# Patient Record
Sex: Female | Born: 1990 | Race: White | Hispanic: No | Smoking: Never smoker
Health system: Southern US, Community
[De-identification: ages and names within clinical notes are randomized; demographics above are authoritative.]

## PROBLEM LIST (undated history)

## (undated) DIAGNOSIS — F329 Major depressive disorder, single episode, unspecified: Secondary | ICD-10-CM

## (undated) DIAGNOSIS — F32A Depression, unspecified: Secondary | ICD-10-CM

## (undated) DIAGNOSIS — R87612 Low grade squamous intraepithelial lesion on cytologic smear of cervix (LGSIL): Secondary | ICD-10-CM

## (undated) DIAGNOSIS — F419 Anxiety disorder, unspecified: Secondary | ICD-10-CM

## (undated) DIAGNOSIS — Z803 Family history of malignant neoplasm of breast: Secondary | ICD-10-CM

## (undated) HISTORY — DX: Low grade squamous intraepithelial lesion on cytologic smear of cervix (LGSIL): R87.612

## (undated) HISTORY — DX: Depression, unspecified: F32.A

## (undated) HISTORY — DX: Family history of malignant neoplasm of breast: Z80.3

## (undated) HISTORY — DX: Major depressive disorder, single episode, unspecified: F32.9

## (undated) HISTORY — DX: Anxiety disorder, unspecified: F41.9

## (undated) HISTORY — PX: INTRAUTERINE DEVICE (IUD) INSERTION: SHX5877

---

## 2014-02-26 ENCOUNTER — Emergency Department (HOSPITAL_COMMUNITY): Payer: No Typology Code available for payment source

## 2014-02-26 ENCOUNTER — Encounter (HOSPITAL_COMMUNITY): Payer: Self-pay | Admitting: Emergency Medicine

## 2014-02-26 ENCOUNTER — Emergency Department (HOSPITAL_COMMUNITY)
Admission: EM | Admit: 2014-02-26 | Discharge: 2014-02-26 | Disposition: A | Discharge status: Home / Self Care | Payer: No Typology Code available for payment source | Attending: Emergency Medicine | Admitting: Emergency Medicine

## 2014-02-26 DIAGNOSIS — T148XXA Other injury of unspecified body region, initial encounter: Secondary | ICD-10-CM

## 2014-02-26 DIAGNOSIS — Z793 Long term (current) use of hormonal contraceptives: Secondary | ICD-10-CM | POA: Diagnosis not present

## 2014-02-26 DIAGNOSIS — S299XXA Unspecified injury of thorax, initial encounter: Secondary | ICD-10-CM | POA: Diagnosis present

## 2014-02-26 DIAGNOSIS — W25XXXA Contact with sharp glass, initial encounter: Secondary | ICD-10-CM | POA: Diagnosis not present

## 2014-02-26 DIAGNOSIS — R0789 Other chest pain: Secondary | ICD-10-CM

## 2014-02-26 DIAGNOSIS — S1093XA Contusion of unspecified part of neck, initial encounter: Secondary | ICD-10-CM | POA: Diagnosis not present

## 2014-02-26 DIAGNOSIS — T148 Other injury of unspecified body region: Secondary | ICD-10-CM | POA: Diagnosis not present

## 2014-02-26 DIAGNOSIS — Y9389 Activity, other specified: Secondary | ICD-10-CM | POA: Diagnosis not present

## 2014-02-26 DIAGNOSIS — S0081XA Abrasion of other part of head, initial encounter: Secondary | ICD-10-CM | POA: Insufficient documentation

## 2014-02-26 DIAGNOSIS — Z23 Encounter for immunization: Secondary | ICD-10-CM | POA: Insufficient documentation

## 2014-02-26 DIAGNOSIS — Y92410 Unspecified street and highway as the place of occurrence of the external cause: Secondary | ICD-10-CM | POA: Insufficient documentation

## 2014-02-26 DIAGNOSIS — S24109A Unspecified injury at unspecified level of thoracic spinal cord, initial encounter: Secondary | ICD-10-CM | POA: Diagnosis not present

## 2014-02-26 DIAGNOSIS — S20219A Contusion of unspecified front wall of thorax, initial encounter: Secondary | ICD-10-CM | POA: Diagnosis not present

## 2014-02-26 LAB — CBC WITH DIFFERENTIAL/PLATELET
BASOS ABS: 0 10*3/uL (ref 0.0–0.1)
Basophils Relative: 0 % (ref 0–1)
Eosinophils Absolute: 0.1 10*3/uL (ref 0.0–0.7)
Eosinophils Relative: 1 % (ref 0–5)
HCT: 39.2 % (ref 36.0–46.0)
Hemoglobin: 13 g/dL (ref 12.0–15.0)
LYMPHS PCT: 21 % (ref 12–46)
Lymphs Abs: 2 10*3/uL (ref 0.7–4.0)
MCH: 28.3 pg (ref 26.0–34.0)
MCHC: 33.2 g/dL (ref 30.0–36.0)
MCV: 85.2 fL (ref 78.0–100.0)
Monocytes Absolute: 0.9 10*3/uL (ref 0.1–1.0)
Monocytes Relative: 9 % (ref 3–12)
NEUTROS ABS: 6.4 10*3/uL (ref 1.7–7.7)
Neutrophils Relative %: 69 % (ref 43–77)
PLATELETS: 226 10*3/uL (ref 150–400)
RBC: 4.6 MIL/uL (ref 3.87–5.11)
RDW: 13.2 % (ref 11.5–15.5)
WBC: 9.3 10*3/uL (ref 4.0–10.5)

## 2014-02-26 LAB — I-STAT CHEM 8, ED
BUN: 9 mg/dL (ref 6–23)
CHLORIDE: 103 meq/L (ref 96–112)
Calcium, Ion: 1.19 mmol/L (ref 1.12–1.23)
Creatinine, Ser: 0.6 mg/dL (ref 0.50–1.10)
Glucose, Bld: 102 mg/dL — ABNORMAL HIGH (ref 70–99)
HCT: 43 % (ref 36.0–46.0)
Hemoglobin: 14.6 g/dL (ref 12.0–15.0)
Potassium: 4.2 mEq/L (ref 3.7–5.3)
SODIUM: 139 meq/L (ref 137–147)
TCO2: 23 mmol/L (ref 0–100)

## 2014-02-26 LAB — I-STAT BETA HCG BLOOD, ED (MC, WL, AP ONLY)

## 2014-02-26 MED ORDER — IBUPROFEN 600 MG PO TABS: 600.0000 mg | ORAL_TABLET | Freq: Four times a day (QID) | ORAL | Status: DC | PRN

## 2014-02-26 MED ORDER — TRAMADOL HCL 50 MG PO TABS: 50.0000 mg | ORAL_TABLET | Freq: Four times a day (QID) | ORAL | Status: DC | PRN

## 2014-02-26 MED ORDER — NEOMYCIN-POLYMYXIN-PRAMOXINE 1 % EX CREA
TOPICAL_CREAM | Freq: Two times a day (BID) | CUTANEOUS | Status: DC
Start: 2014-02-26 — End: 2017-07-04

## 2014-02-26 MED ORDER — TETANUS-DIPHTH-ACELL PERTUSSIS 5-2.5-18.5 LF-MCG/0.5 IM SUSP
0.5000 mL | Freq: Once | INTRAMUSCULAR | Status: AC
  Administered 2014-02-26: 0.5 mL via INTRAMUSCULAR
  Filled 2014-02-26: qty 0.5

## 2014-02-26 MED ORDER — FENTANYL CITRATE 0.05 MG/ML IJ SOLN
50.0000 ug | Freq: Once | INTRAMUSCULAR | Status: AC
  Administered 2014-02-26: 50 ug via INTRAVENOUS
  Filled 2014-02-26: qty 2

## 2014-02-26 MED ORDER — LIDOCAINE-EPINEPHRINE (PF) 2 %-1:200000 IJ SOLN
10.0000 mL | Freq: Once | INTRAMUSCULAR | Status: DC
  Filled 2014-02-26: qty 20

## 2014-02-26 MED ORDER — LORAZEPAM 2 MG/ML IJ SOLN
1.0000 mg | Freq: Once | INTRAMUSCULAR | Status: AC
  Administered 2014-02-26: 1 mg via INTRAVENOUS
  Filled 2014-02-26: qty 1

## 2014-02-26 MED ORDER — TETANUS-DIPHTH-ACELL PERTUSSIS 5-2.5-18.5 LF-MCG/0.5 IM SUSP
0.5000 mL | Freq: Once | INTRAMUSCULAR | Status: AC
Start: 2014-02-26 — End: 2014-02-26

## 2014-02-26 NOTE — ED Notes (Signed)
Dr. Nanavanti at the bedside.  

## 2014-02-26 NOTE — ED Notes (Signed)
Offered pain medication, patient does not want at this time.
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
## 2014-02-26 NOTE — ED Notes (Signed)
Dr. Shyrl NumbersNanavanti at the bedside to update on results.

## 2014-08-28 ENCOUNTER — Encounter: Payer: Self-pay | Admitting: Obstetrics & Gynecology

## 2014-08-28 ENCOUNTER — Ambulatory Visit (INDEPENDENT_AMBULATORY_CARE_PROVIDER_SITE_OTHER): Payer: BLUE CROSS/BLUE SHIELD | Admitting: Obstetrics & Gynecology

## 2014-08-28 VITALS — BP 120/59 | HR 62 | Resp 16 | Ht 63.0 in | Wt 162.0 lb

## 2014-08-28 DIAGNOSIS — Z01812 Encounter for preprocedural laboratory examination: Secondary | ICD-10-CM

## 2014-08-28 DIAGNOSIS — Z30019 Encounter for initial prescription of contraceptives, unspecified: Secondary | ICD-10-CM

## 2014-08-28 DIAGNOSIS — R635 Abnormal weight gain: Secondary | ICD-10-CM

## 2014-08-28 DIAGNOSIS — Z113 Encounter for screening for infections with a predominantly sexual mode of transmission: Secondary | ICD-10-CM | POA: Diagnosis not present

## 2014-08-28 DIAGNOSIS — Z30430 Encounter for insertion of intrauterine contraceptive device: Secondary | ICD-10-CM

## 2014-08-28 MED ORDER — LEVONORGESTREL 13.5 MG IU IUD
1.0000 | INTRAUTERINE_SYSTEM | Freq: Once | INTRAUTERINE | Status: AC
  Administered 2014-08-28: 1 via INTRAUTERINE

## 2014-08-28 NOTE — Addendum Note (Signed)
Addended by: Granville LewisLARK, Alicen Donalson L on: 08/28/2014 12:15 PM   Modules accepted: Orders

## 2014-08-28 NOTE — Progress Notes (Signed)
   Subjective:    Patient ID: Suzanne Boyd, female    DOB: 06-06-1990, 24 y.o.   MRN: [id number]  HPI 24 yo SW G0 here today to discuss contraception and weight gain. She has gained 20 # in the last year. She denies hair loss or constipation.  Coitarche at 24 yo, 25 partners in lifetime. Used depo provera in high school but had weight gain. She tried OCPs but sometimes forgot to take them.   Review of Systems     Objective:   Physical Exam She was menstruating. UPT negative, consent signed, Time out procedure done. Cervix prepped with betadine and grasped with a single tooth tenaculum. Christean GriefSkyla was easily placed and the strings were cut to 3-4 cm. Uterus sounded to 8 cm. She tolerated the procedure well.         Assessment & Plan:  Preventative care- STI testing RTC for pap as she is on her period today.

## 2014-08-28 NOTE — Addendum Note (Signed)
Addended by: Granville LewisLARK, Margarito Dehaas L on: 08/28/2014 12:18 PM   Modules accepted: Orders

## 2014-08-29 LAB — GC/CHLAMYDIA PROBE AMP
CT Probe RNA: NEGATIVE
GC Probe RNA: NEGATIVE

## 2014-10-03 ENCOUNTER — Ambulatory Visit (INDEPENDENT_AMBULATORY_CARE_PROVIDER_SITE_OTHER): Payer: BLUE CROSS/BLUE SHIELD | Admitting: Obstetrics & Gynecology

## 2014-10-03 ENCOUNTER — Encounter: Payer: Self-pay | Admitting: Obstetrics & Gynecology

## 2014-10-03 VITALS — BP 130/73 | HR 69 | Resp 16 | Ht 63.0 in | Wt 162.0 lb

## 2014-10-03 DIAGNOSIS — Z124 Encounter for screening for malignant neoplasm of cervix: Secondary | ICD-10-CM | POA: Diagnosis not present

## 2014-10-03 DIAGNOSIS — Z01419 Encounter for gynecological examination (general) (routine) without abnormal findings: Secondary | ICD-10-CM | POA: Diagnosis not present

## 2014-10-03 DIAGNOSIS — Z113 Encounter for screening for infections with a predominantly sexual mode of transmission: Secondary | ICD-10-CM | POA: Diagnosis not present

## 2014-10-03 DIAGNOSIS — Z0000 Encounter for general adult medical examination without abnormal findings: Secondary | ICD-10-CM

## 2014-10-03 NOTE — Progress Notes (Signed)
Subjective:    Suzanne Boyd is a 24 y.o. SW 380female who presents for an annual exam. The patient has no complaints today except that she is having pain with and after IC since getting the Mirena last month.  The patient is sexually active. GYN screening history: last pap: was normal. The patient wears seatbelts: yes. The patient participates in regular exercise: no. Has the patient ever been transfused or tattooed?: yes. The patient reports that there is not domestic violence in her life.   Menstrual History: OB History    Gravida Para Term Preterm AB TAB SAB Ectopic Multiple Living   0 0 0 0 0 0 0 0 0 0       Menarche age: [age]  Patient's last menstrual period was 10/01/2014.    The following portions of the patient's history were reviewed and updated as appropriate: allergies, current medications, past family history, past medical history, past social history, past surgical history and problem list.  Review of Systems Pertinent items are noted in HPI. Monogamous for 6 months. Coitarche at [age]. Works at [employer].DesiresSTI testing.   Objective:    BP 130/73 mmHg  Pulse 69  Resp 16  Ht 5\' 3"  (1.6 m)  Wt 162 lb (73.483 kg)  BMI 28.70 kg/m2  LMP 10/01/2014  General Appearance:    Alert, cooperative, no distress, appears stated age  Head:    Normocephalic, without obvious abnormality, atraumatic  Eyes:    PERRL, conjunctiva/corneas clear, EOM's intact, fundi    benign, both eyes  Ears:    Normal TM's and external ear canals, both ears  Nose:   Nares normal, septum midline, mucosa normal, no drainage    or sinus tenderness  Throat:   Lips, mucosa, and tongue normal; teeth and gums normal  Neck:   Supple, symmetrical, trachea midline, no adenopathy;    thyroid:  no enlargement/tenderness/nodules; no carotid   bruit or JVD  Back:     Symmetric, no curvature, ROM normal, no CVA tenderness  Lungs:     Clear to auscultation bilaterally, respirations unlabored  Chest Wall:    No  tenderness or deformity   Heart:    Regular rate and rhythm, S1 and S2 normal, no murmur, rub   or gallop  Breast Exam:    No tenderness, masses, or nipple abnormality  Abdomen:     Soft, non-tender, bowel sounds active all four quadrants,    no masses, no organomegaly  Genitalia:    Normal female without lesion, discharge or tenderness, Mirena strings seen about 2-3 cm long, NSSA, NT, normal adnexa     Extremities:   Extremities normal, atraumatic, no cyanosis or edema  Pulses:   2+ and symmetric all extremities  Skin:   Skin color, texture, turgor normal, no rashes or lesions  Lymph nodes:   Cervical, supraclavicular, and axillary nodes normal  Neurologic:   CNII-XII intact, normal strength, sensation and reflexes    throughout  .    Assessment:    Healthy female exam.    Plan:      Thin prep pap STI testing Gyn u/s if IBU doesn't cure her dyspareunia

## 2014-10-04 ENCOUNTER — Telehealth: Payer: Self-pay | Admitting: *Deleted

## 2014-10-04 LAB — SYPHILIS: RPR W/REFLEX TO RPR TITER AND TREPONEMAL ANTIBODIES, TRADITIONAL SCREENING AND DIAGNOSIS ALGORITHM

## 2014-10-04 LAB — HIV ANTIBODY (ROUTINE TESTING W REFLEX): HIV 1&2 Ab, 4th Generation: NONREACTIVE

## 2014-10-04 LAB — HEPATITIS B SURFACE ANTIGEN: Hepatitis B Surface Ag: NEGATIVE

## 2014-10-04 LAB — HEPATITIS C ANTIBODY: HCV Ab: NEGATIVE

## 2014-10-04 NOTE — Telephone Encounter (Signed)
LM on voicemail that recent labs drawn were neg

## 2014-10-07 LAB — CYTOLOGY - PAP

## 2014-10-10 ENCOUNTER — Ambulatory Visit: Payer: BLUE CROSS/BLUE SHIELD | Admitting: Obstetrics & Gynecology

## 2014-10-11 ENCOUNTER — Encounter: Payer: Self-pay | Admitting: Obstetrics & Gynecology

## 2014-10-11 DIAGNOSIS — IMO0002 Reserved for concepts with insufficient information to code with codable children: Secondary | ICD-10-CM | POA: Insufficient documentation

## 2014-10-15 ENCOUNTER — Telehealth: Payer: Self-pay | Admitting: *Deleted

## 2014-10-15 ENCOUNTER — Encounter: Payer: Self-pay | Admitting: *Deleted

## 2014-10-15 NOTE — Telephone Encounter (Signed)
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  Thanks

## 2014-10-15 NOTE — Telephone Encounter (Signed)
Letter sent to pt to call office to discuss pap results as she needs a colpo

## 2014-10-24 ENCOUNTER — Other Ambulatory Visit: Payer: BLUE CROSS/BLUE SHIELD

## 2014-10-30 ENCOUNTER — Ambulatory Visit: Payer: BLUE CROSS/BLUE SHIELD | Admitting: Obstetrics & Gynecology

## 2014-11-06 ENCOUNTER — Telehealth: Payer: Self-pay | Admitting: *Deleted

## 2014-11-06 NOTE — Telephone Encounter (Signed)
Certified letter mailed to pt's home address to inform her to call the office to discuss her abnormal pap smear.

## 2014-11-27 ENCOUNTER — Encounter: Payer: Self-pay | Admitting: Obstetrics & Gynecology

## 2014-11-27 ENCOUNTER — Ambulatory Visit (INDEPENDENT_AMBULATORY_CARE_PROVIDER_SITE_OTHER): Payer: BLUE CROSS/BLUE SHIELD | Admitting: Obstetrics & Gynecology

## 2014-11-27 VITALS — BP 105/62 | HR 57 | Resp 16 | Ht 63.0 in | Wt 159.0 lb

## 2014-11-27 DIAGNOSIS — Z01812 Encounter for preprocedural laboratory examination: Secondary | ICD-10-CM | POA: Diagnosis not present

## 2014-11-27 DIAGNOSIS — R87810 Cervical high risk human papillomavirus (HPV) DNA test positive: Secondary | ICD-10-CM | POA: Diagnosis not present

## 2014-11-27 DIAGNOSIS — R87612 Low grade squamous intraepithelial lesion on cytologic smear of cervix (LGSIL): Secondary | ICD-10-CM

## 2014-11-27 DIAGNOSIS — Z30431 Encounter for routine checking of intrauterine contraceptive device: Secondary | ICD-10-CM | POA: Diagnosis not present

## 2014-11-27 DIAGNOSIS — IMO0002 Reserved for concepts with insufficient information to code with codable children: Secondary | ICD-10-CM

## 2014-11-27 DIAGNOSIS — T8384XA Pain from genitourinary prosthetic devices, implants and grafts, initial encounter: Secondary | ICD-10-CM | POA: Diagnosis not present

## 2014-11-27 LAB — POCT URINE PREGNANCY: Preg Test, Ur: NEGATIVE

## 2014-11-27 NOTE — Addendum Note (Signed)
Addended by: Granville LewisLARK, Staria Birkhead L on: 11/27/2014 11:25 AM   Modules accepted: Orders

## 2014-11-27 NOTE — Patient Instructions (Signed)
Return to clinic for any scheduled appointments or for any gynecologic concerns as needed.   

## 2014-11-27 NOTE — Progress Notes (Signed)
    GYNECOLOGY CLINIC COLPOSCOPY PROCEDURE NOTE/MIRENA SURVEILLANCE NOTE  24 y.o. G0 here for colposcopy for low-grade squamous intraepithelial neoplasia (LGSIL - encompassing HPV,mild dysplasia,CIN I) cannot rule out high grade lesion pap smear on 10/03/2014. Discussed role for HPV in cervical dysplasia, need for surveillance.  Patient also desires IUD removal. Worried about increased risk of ectopic pregnancy, also reports occasional cramping.  Mirena was placed on 08/28/14.  Had a long discussion about expected cramping after IUD placement which can last up to six months, also emphasized overall rare rate of pregnancy with it being the most effect LARC, and hence decreased risk of ectopic pregnancy. Patient will leave it in for now, will call or return if she has more symptoms.  For the colposcopy, she was given informed consent, signed copy in the chart, time out was performed.  Placed in lithotomy position. Cervix viewed with speculum and colposcope after application of acetic acid. IUD strings visualized.  Colposcopy adequate? Yes with help of endocervical speculum. Acetowhite lesion(s) noted at 6 o'clock; corresponding biopsies obtained.  ECC specimen obtained.  During biopsy process, IUD strings were inadvertently clipped leaving about 1 cm in length outside external cervical os.  Patient was informed and reassured that this did not interfere with its function. All specimens were labelled and sent to pathology.  Patient was given post procedure instructions.  Will follow up pathology and manage accordingly.  Routine preventative health maintenance measures emphasized.  Total face-to-face time with patient: 30 minutes; 15 minutes of encounter was spent on counseling about Mirena IUD, expected side effects, efficacy and risks and further coordination of care.   Jaynie CollinsUGONNA  Suzanne Magley, MD, FACOG Attending Obstetrician & Gynecologist Center for [facility], [facility]

## 2014-11-29 ENCOUNTER — Telehealth: Payer: Self-pay | Admitting: *Deleted

## 2014-11-29 NOTE — Telephone Encounter (Signed)
Pt notified of neg colposcopy and needs repeat pap in 1 year.

## 2014-11-29 NOTE — Telephone Encounter (Signed)
-----   Message from Tereso NewcomerUgonna A Anyanwu, MD sent at 11/29/2014 11:23 AM EDT ----- Benign colposcopy, repeat pap next year. Please call to inform patient of results and need for appointment.

## 2015-06-03 ENCOUNTER — Other Ambulatory Visit (INDEPENDENT_AMBULATORY_CARE_PROVIDER_SITE_OTHER): Payer: Self-pay | Admitting: *Deleted

## 2015-06-03 ENCOUNTER — Ambulatory Visit: Payer: BLUE CROSS/BLUE SHIELD | Admitting: Obstetrics & Gynecology

## 2015-06-03 VITALS — BP 119/69 | HR 69 | Resp 16 | Wt 159.0 lb

## 2015-06-03 DIAGNOSIS — Z113 Encounter for screening for infections with a predominantly sexual mode of transmission: Secondary | ICD-10-CM

## 2015-06-03 DIAGNOSIS — N760 Acute vaginitis: Secondary | ICD-10-CM

## 2015-06-03 DIAGNOSIS — T7589XA Other specified effects of external causes, initial encounter: Secondary | ICD-10-CM

## 2015-06-03 NOTE — Progress Notes (Signed)
Pt here with c/o vaginal discharge with odor.  She states that she does have a new partner and would like STD testing as well.  Speculum exam did show a frothy discharge with odor.  Wet prep sent to lab along with GC.Clhamydia and STD panel.  Will notify pt with results.

## 2015-06-04 ENCOUNTER — Telehealth: Payer: Self-pay | Admitting: *Deleted

## 2015-06-04 DIAGNOSIS — N760 Acute vaginitis: Principal | ICD-10-CM

## 2015-06-04 DIAGNOSIS — B9689 Other specified bacterial agents as the cause of diseases classified elsewhere: Secondary | ICD-10-CM

## 2015-06-04 LAB — HIV ANTIBODY (ROUTINE TESTING W REFLEX): HIV 1&2 Ab, 4th Generation: NONREACTIVE

## 2015-06-04 LAB — GC/CHLAMYDIA PROBE AMP
CT PROBE, AMP APTIMA: NOT DETECTED
GC PROBE AMP APTIMA: NOT DETECTED

## 2015-06-04 LAB — HEPATITIS C ANTIBODY: HCV Ab: NEGATIVE

## 2015-06-04 LAB — SYPHILIS: RPR W/REFLEX TO RPR TITER AND TREPONEMAL ANTIBODIES, TRADITIONAL SCREENING AND DIAGNOSIS ALGORITHM

## 2015-06-04 LAB — HEPATITIS B SURFACE ANTIGEN: HEP B S AG: NEGATIVE

## 2015-06-04 MED ORDER — METRONIDAZOLE 500 MG PO TABS: 500.0000 mg | ORAL_TABLET | Freq: Two times a day (BID) | ORAL | Status: DC

## 2015-06-04 MED ORDER — FLUCONAZOLE 150 MG PO TABS: 150.0000 mg | ORAL_TABLET | Freq: Once | ORAL | Status: DC

## 2015-06-04 NOTE — Telephone Encounter (Signed)
Pt notified of test results.  Per protocol Flagyl 500 mg sent to her pharmacy along with 1 Diflucan 150.

## 2017-07-04 ENCOUNTER — Ambulatory Visit (INDEPENDENT_AMBULATORY_CARE_PROVIDER_SITE_OTHER): Payer: Commercial Managed Care - PPO | Admitting: Obstetrics and Gynecology

## 2017-07-04 ENCOUNTER — Telehealth: Payer: Self-pay | Admitting: Obstetrics and Gynecology

## 2017-07-04 ENCOUNTER — Encounter: Payer: Self-pay | Admitting: Obstetrics and Gynecology

## 2017-07-04 VITALS — BP 110/70 | HR 56 | Ht 63.0 in | Wt 173.0 lb

## 2017-07-04 DIAGNOSIS — Z6830 Body mass index (BMI) 30.0-30.9, adult: Secondary | ICD-10-CM | POA: Diagnosis not present

## 2017-07-04 DIAGNOSIS — E669 Obesity, unspecified: Secondary | ICD-10-CM | POA: Insufficient documentation

## 2017-07-04 DIAGNOSIS — E663 Overweight: Secondary | ICD-10-CM | POA: Insufficient documentation

## 2017-07-04 DIAGNOSIS — Z124 Encounter for screening for malignant neoplasm of cervix: Secondary | ICD-10-CM

## 2017-07-04 LAB — HM PAP SMEAR: HM Pap smear: NORMAL

## 2017-07-04 NOTE — Progress Notes (Signed)
Gynecology Annual Exam   PCP: Patient, No Pcp Per  Chief Complaint:  Chief Complaint  Patient presents with  . Gynecologic Exam    NPA    History of Present Illness: Patient is a 27 y.o. G0P0000 presents for annual exam. The patient has no complaints today.   LMP: Patient's last menstrual period was 06/20/2017 (exact date). Average Interval: irregular, Duration of flow: 7 days Heavy Menses: no Clots: no Intermenstrual Bleeding: no Postcoital Bleeding: no Dysmenorrhea: no  The patient is sexually active. She currently uses IUD for contraception. She denies dyspareunia.  The patient does not perform self breast exams.  There is notable family history of breast or ovarian cancer in her family.  The patient wears seatbelts: yes.   The patient has regular exercise: no.    The patient denies current symptoms of depression.    Review of Systems: ROS  Past Medical History:  Past Medical History:  Diagnosis Date  . Anxiety   . Depression     Past Surgical History:  Past Surgical History:  Procedure Laterality Date  . INTRAUTERINE DEVICE (IUD) INSERTION     inserted 08/2014 skyla    Gynecologic History:  Patient's last menstrual period was 06/20/2017 (exact date). Contraception: IUD Last Pap: Results were: LGSIL, some HGSIL   Obstetric History: G0P0000  Family History:  Family History  Problem Relation Age of Onset  . Cancer Maternal Grandmother        brain  . Depression Maternal Grandmother   . Cancer Maternal Aunt        breast  . Cancer Maternal Aunt        breast  . Cancer Maternal Aunt        breast  . Cancer Maternal Aunt        breast  . Cancer Maternal Aunt        breast  . Cancer Maternal Aunt        breast  . Cancer Maternal Aunt        breast  . Cancer - Colon Maternal Uncle   . Hyperlipidemia Mother   . Hypertension Mother     Social History:  Social History   Socioeconomic History  . Marital status: Single    Spouse name: Not  on file  . Number of children: Not on file  . Years of education: Not on file  . Highest education level: Not on file  Social Needs  . Financial resource strain: Not on file  . Food insecurity - worry: Not on file  . Food insecurity - inability: Not on file  . Transportation needs - medical: Not on file  . Transportation needs - non-medical: Not on file  Occupational History  . Occupation: Air cabin crewmanager retail  Tobacco Use  . Smoking status: Never Smoker  . Smokeless tobacco: Never Used  Substance and Sexual Activity  . Alcohol use: Yes    Alcohol/week: 0.0 oz    Comment: occasional  . Drug use: No  . Sexual activity: Yes    Partners: Male    Birth control/protection: IUD  Other Topics Concern  . Not on file  Social History Narrative  . Not on file    Allergies:  No Known Allergies  Medications: Prior to Admission medications   Medication Sig Start Date End Date Taking? Authorizing Provider  ARIPiprazole (ABILIFY) 5 MG tablet Take 5 mg by mouth daily.   Yes [provider]  escitalopram (LEXAPRO) 10 MG tablet Take 10  mg by mouth daily.   Yes [provider]  ibuprofen (ADVIL,MOTRIN) 600 MG tablet Take 1 tablet (600 mg total) by mouth every 6 (six) hours as needed. 02/26/14  Yes Derwood Kaplan, MD  Levonorgestrel 13.5 MG IUD by Intrauterine route.    Yes [provider]  ARIPiprazole (ABILIFY) 10 MG tablet  07/01/17   [provider]    Physical Exam Vitals: Blood pressure 110/70, pulse (!) 56, height 5\' 3"  (1.6 m), weight 173 lb (78.5 kg), last menstrual period 06/20/2017.  General: NAD HEENT: normocephalic, anicteric Thyroid: no enlargement, no palpable nodules Pulmonary: No increased work of breathing, CTAB Cardiovascular: RRR, distal pulses 2+ Breast: Breast symmetrical, no tenderness, no palpable nodules or masses, no skin or nipple retraction present, no nipple discharge.  No axillary or supraclavicular lymphadenopathy. Abdomen:  NABS, soft, non-tender, non-distended.  Umbilicus without lesions.  No hepatomegaly, splenomegaly or masses palpable. No evidence of hernia  Genitourinary:  External: Normal external female genitalia.  Normal urethral meatus, normal  Bartholin's and Skene's glands.    Vagina: Normal vaginal mucosa, no evidence of prolapse.    Cervix: Grossly normal in appearance, no bleeding  Uterus: Non-enlarged, mobile, normal contour.  No CMT  Adnexa: ovaries non-enlarged, no adnexal masses  Rectal: deferred  Lymphatic: no evidence of inguinal lymphadenopathy Extremities: no edema, erythema, or tenderness Neurologic: Grossly intact Psychiatric: mood appropriate, affect full  Female chaperone present for pelvic and breast  portions of the physical exam    Assessment: 27 y.o. G0P0000 routine annual exam  Plan: Problem List Items Addressed This Visit    None    Visit Diagnoses    Screening for cervical cancer    -  Primary   Relevant Orders   PapIG, CtNgTv, HPV, rfx 16/18      1) STI screening was offered and accepted  2)  ASCCP guidelines and rational discussed.  Patient opts for every 3 years screening interval. Pap smear performed today.  3) Contraception - the patient is currently using  IUD.  She is happy with her current form of contraception and plans to continue. Will need a new IUD in April. Will have patient return in March for removal and replacement.  4) Routine healthcare maintenance including cholesterol, diabetes screening discussed Declines  5) Return in about 2 weeks (around 07/18/2017) for IUD removal and replacement.  6) Has completed gardasil vaccination.  7) Given information on genetic screening for family history of breast cancer. (6 relatives) Patient will return if she desires genetic screening.   Adelene Idler MD [facility], [facility] 07/04/2017, 1:11 PM

## 2017-07-04 NOTE — Telephone Encounter (Signed)
3/4 at 130 for kyleena insert with schuman

## 2017-07-07 LAB — PAPIG, CTNGTV, HPV, RFX 16/18
Chlamydia, Nuc. Acid Amp: NEGATIVE
Gonococcus, Nuc. Acid Amp: NEGATIVE
HPV, high-risk: NEGATIVE
PAP SMEAR COMMENT: 0
Trich vag by NAA: NEGATIVE

## 2017-07-07 NOTE — Progress Notes (Signed)
Released to mychart

## 2017-07-12 NOTE — Telephone Encounter (Signed)
Noted. Will order to arrive by apt date/time. 

## 2017-07-18 ENCOUNTER — Ambulatory Visit (INDEPENDENT_AMBULATORY_CARE_PROVIDER_SITE_OTHER): Payer: Commercial Managed Care - PPO | Admitting: Obstetrics and Gynecology

## 2017-07-18 ENCOUNTER — Encounter: Payer: Self-pay | Admitting: Obstetrics and Gynecology

## 2017-07-18 VITALS — BP 128/68 | HR 60 | Ht 63.0 in | Wt 176.0 lb

## 2017-07-18 DIAGNOSIS — Z30433 Encounter for removal and reinsertion of intrauterine contraceptive device: Secondary | ICD-10-CM | POA: Diagnosis not present

## 2017-07-18 DIAGNOSIS — N6452 Nipple discharge: Secondary | ICD-10-CM

## 2017-07-18 MED ORDER — FLUCONAZOLE 150 MG PO TABS: 150.0000 mg | ORAL_TABLET | Freq: Once | ORAL | 3 refills | Status: AC

## 2017-07-18 MED ORDER — SULFAMETHOXAZOLE-TRIMETHOPRIM 800-160 MG PO TABS: 1.0000 | ORAL_TABLET | Freq: Two times a day (BID) | ORAL | 1 refills | Status: DC

## 2017-07-18 MED ORDER — LEVONORGESTREL 19.5 MG IU IUD: 1.0000 | INTRAUTERINE_SYSTEM | Freq: Once | INTRAUTERINE | Status: DC

## 2017-07-18 NOTE — Progress Notes (Signed)
IUD PROCEDURE NOTE:  Suzanne Boyd is a 27 y.o. G0P0000 here for IUD removal and reinsertion. She had a Skyla IUD placed approximately 3 years ago. Since that time, she states that she has been happy with this medication. No GYN concerns.  Last pap smear was normal.  Patient had complaints of nipple discharge which has been present for more than 3 years from a nipple piercing. She has now removed the nipple piercing, but has a persistent scab on the left breast from which she daily expresses a small amount of purulent discharge. No redness or erythema of the nipple. No lumps of breasts masses.   Review of Systems  Constitutional: Positive for malaise/fatigue.  Cardiovascular:       Reports difficulty breathing while laying down  Genitourinary: Positive for frequency.  Musculoskeletal:       Muscle weakness  Psychiatric/Behavioral: Positive for depression. The patient is nervous/anxious.    The following portions of the patient's history were reviewed and updated as appropriate: allergies, current medications, past family history, past medical history, past social history, past surgical history and problem list.  Patient Active Problem List   Diagnosis Date Noted  . BMI 30.0-30.9,adult 07/04/2017  . 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 ARIPiprazole (ABILIFY) 10 MG tablet     . ARIPiprazole (ABILIFY) 5 MG tablet Take 5 mg by mouth daily.    Marland Kitchen escitalopram (LEXAPRO) 10 MG tablet Take 10 mg by mouth daily.    Marland Kitchen ibuprofen (ADVIL,MOTRIN) 600 MG tablet Take 1 tablet (600 mg total) by mouth every 6 (six) hours as needed. 30 tablet 0  . Levonorgestrel 13.5 MG IUD by Intrauterine route.      No current facility-administered medications on file prior to visit.    Allergies: has No Known Allergies.  Physical Exam:  BP 128/68    Pulse 60   Ht 5\' 3"  (1.6 m)   Wt 176 lb (79.8 kg)   LMP 06/20/2017 (Exact Date)   BMI 31.18 kg/m  Body mass index is 31.18 kg/m. Constitutional: Well nourished, well developed female in no acute distress.  Abdomen: diffusely non tender to palpation, non distended, and no masses, hernias Neuro: Grossly intact Psych:  Normal mood and affect.   Breast: Small purulent bloody discharge expressed from left nipple. No induration, no erythema, no skin changes.  Pelvic exam:  Two IUD strings present seen coming from the cervical os. EGBUS, vaginal vault and cervix: within normal limits  IUD Removal Strings of IUD identified and grasped.  IUD removed without problem.  Pt tolerated this well.  IUD noted to be intact.   IUD Insertion Procedure Note Patient identified, informed consent performed, consent signed.   Discussed risks of irregular bleeding, cramping, infection, malpositioning or misplacement of the IUD outside the uterus which may require further procedure such as laparoscopy, risk of failure <1%. Time out was performed.  Urine pregnancy test negative.  A bimanual exam showed the uterus to be anteverted.  Speculum placed in the vagina.  Cervix visualized.  Cleaned with Betadine x 2.  Grasped anteriorly with a single tooth tenaculum.  Uterus sounded to 7 cm.   IUD placed per manufacturer's recommendations.  Strings trimmed to 3 cm. Tenaculum was removed, good hemostasis noted.  Patient tolerated procedure well.   Patient was given post-procedure instructions.  She was advised to have  backup contraception for one week.  Patient was also asked to check IUD strings periodically and follow up in 4 weeks for IUD check.  Patient given prescription for 7 days of bactrim for nipple discharge. Asked her to wash daily with hibiclens and apply hydrogen peroxide with a q-tip.   Patient has appointment with PCP for issues surrounding fatigue, dizziness and difficulty breathing.   Adelene Idlerhristanna  Schuman MD [facility], [facility] 07/18/2017  1:39 PM

## 2017-07-18 NOTE — Patient Instructions (Addendum)
  HIBICLENS   IUD PLACEMENT POST-PROCEDURE INSTRUCTIONS  1. You may take Ibuprofen, Aleve or Tylenol for pain if needed.  Cramping should resolve within in 24 hours.  2. You may have a small amount of spotting.  You should wear a mini pad for the next few days.  3. You may have intercourse after 24 hours.  If you using this for birth control, it is effective immediately.  4. You need to call if you have any pelvic pain, fever, heavy bleeding or foul smelling vaginal discharge.  Irregular bleeding is common the first several months after having an IUD placed. You do not need to call for this reason unless you are concerned.  5. Shower or bathe as normal  6. You should have a follow-up appointment in 4-8 weeks for a re-check to make sure you are not having any problems.

## 2017-07-27 NOTE — Telephone Encounter (Signed)
Kyleena received 07/18/17

## 2017-08-19 ENCOUNTER — Ambulatory Visit: Payer: Commercial Managed Care - PPO | Admitting: Obstetrics and Gynecology

## 2017-08-19 ENCOUNTER — Ambulatory Visit (INDEPENDENT_AMBULATORY_CARE_PROVIDER_SITE_OTHER): Payer: Commercial Managed Care - PPO | Admitting: Obstetrics and Gynecology

## 2017-08-19 ENCOUNTER — Encounter: Payer: Self-pay | Admitting: Obstetrics and Gynecology

## 2017-08-19 VITALS — BP 100/62 | HR 82 | Ht 63.0 in | Wt 183.0 lb

## 2017-08-19 DIAGNOSIS — Z30431 Encounter for routine checking of intrauterine contraceptive device: Secondary | ICD-10-CM

## 2017-08-19 NOTE — Progress Notes (Signed)
   History of Present Illness:  Suzanne LennertBrandi Boyd is a 27 y.o. that had a Suzanne Boyd IUD placed approximately 1 month ago. Since that time, she states that she has had no itching, bleeding, or pain. Patient reports that her nipple discharge has resolved.   PMHx: She  has a past medical history of Anxiety and Depression. Also,  has a past surgical history that includes Intrauterine device (iud) insertion., family history includes Cancer in her maternal aunt, maternal aunt, maternal aunt, maternal aunt, maternal aunt, maternal aunt, maternal aunt, and maternal grandmother; Cancer - Colon in her maternal uncle; Depression in her maternal grandmother; Hyperlipidemia in her mother; Hypertension in her mother.,  reports that she has never smoked. She has never used smokeless tobacco. She reports that she drinks alcohol. She reports that she does not use drugs. No outpatient medications have been marked as taking for the 08/19/17 encounter (Office Visit) with Natale MilchSchuman, Christanna R, MD.   Current Facility-Administered Medications for the 08/19/17 encounter (Office Visit) with Natale MilchSchuman, Christanna R, MD  Medication  . Levonorgestrel IUD 19.5 mg  .  Also, has No Known Allergies..  Review of Systems  Constitutional: Negative for chills, fever, malaise/fatigue and weight loss.  HENT: Negative for congestion, hearing loss and sinus pain.   Eyes: Negative for blurred vision and double vision.  Respiratory: Negative for cough, sputum production, shortness of breath and wheezing.   Cardiovascular: Negative for chest pain, palpitations, orthopnea and leg swelling.  Gastrointestinal: Negative for abdominal pain, constipation, diarrhea, nausea and vomiting.  Genitourinary: Negative for dysuria, flank pain, frequency, hematuria and urgency.  Musculoskeletal: Negative for back pain, falls and joint pain.  Skin: Negative for itching and rash.  Neurological: Negative for dizziness and headaches.  Psychiatric/Behavioral:  Negative for depression, substance abuse and suicidal ideas. The patient is not nervous/anxious.     Physical Exam:  BP 100/62   Pulse 82   Ht 5\' 3"  (1.6 m)   Wt 183 lb (83 kg)   BMI 32.42 kg/m  Body mass index is 32.42 kg/m. Constitutional: Well nourished, well developed female in no acute distress.  Abdomen: diffusely non tender to palpation, non distended, and no masses, hernias Neuro: Grossly intact Psych:  Normal mood and affect.    Pelvic exam:  Two IUD strings present seen coming from the cervical os. Short, just seen right at the cervical opening. EGBUS, vaginal vault and cervix: within normal limits  Assessment: IUD strings present in proper location; pt doing well  Plan: She was told to continue to use barrier contraception, in order to prevent any STIs, and to take a home pregnancy test or call us if she ever thinks she may be pregnant, and that her IUD expires in 5 years.  She was amenable to this plan and we will see her back in 1 year/PRN.  Adelene Idlerhristanna Schuman MD [facility], [facility] 08/19/2017  3:36 PM

## 2017-08-29 ENCOUNTER — Encounter: Payer: Self-pay | Admitting: Family Medicine

## 2017-08-29 ENCOUNTER — Ambulatory Visit
Admission: RE | Admit: 2017-08-29 | Discharge: 2017-08-29 | Disposition: A | Discharge status: Home / Self Care | Payer: Commercial Managed Care - PPO | Source: Ambulatory Visit | Attending: Family Medicine | Admitting: Family Medicine

## 2017-08-29 ENCOUNTER — Ambulatory Visit (INDEPENDENT_AMBULATORY_CARE_PROVIDER_SITE_OTHER): Payer: Commercial Managed Care - PPO | Admitting: Family Medicine

## 2017-08-29 ENCOUNTER — Telehealth: Payer: Self-pay | Admitting: Emergency Medicine

## 2017-08-29 VITALS — BP 108/80 | HR 77 | Temp 97.5°F | Resp 16 | Ht 63.0 in | Wt 186.0 lb

## 2017-08-29 DIAGNOSIS — R55 Syncope and collapse: Secondary | ICD-10-CM

## 2017-08-29 DIAGNOSIS — Z833 Family history of diabetes mellitus: Secondary | ICD-10-CM | POA: Diagnosis not present

## 2017-08-29 DIAGNOSIS — M7731 Calcaneal spur, right foot: Secondary | ICD-10-CM | POA: Insufficient documentation

## 2017-08-29 DIAGNOSIS — N912 Amenorrhea, unspecified: Secondary | ICD-10-CM | POA: Diagnosis not present

## 2017-08-29 DIAGNOSIS — M79669 Pain in unspecified lower leg: Secondary | ICD-10-CM | POA: Diagnosis not present

## 2017-08-29 DIAGNOSIS — F329 Major depressive disorder, single episode, unspecified: Secondary | ICD-10-CM

## 2017-08-29 DIAGNOSIS — M79662 Pain in left lower leg: Secondary | ICD-10-CM | POA: Diagnosis not present

## 2017-08-29 DIAGNOSIS — M7732 Calcaneal spur, left foot: Secondary | ICD-10-CM | POA: Diagnosis not present

## 2017-08-29 DIAGNOSIS — F32A Depression, unspecified: Secondary | ICD-10-CM | POA: Insufficient documentation

## 2017-08-29 DIAGNOSIS — Z13220 Encounter for screening for lipoid disorders: Secondary | ICD-10-CM

## 2017-08-29 DIAGNOSIS — F419 Anxiety disorder, unspecified: Secondary | ICD-10-CM

## 2017-08-29 DIAGNOSIS — M79661 Pain in right lower leg: Secondary | ICD-10-CM | POA: Diagnosis not present

## 2017-08-29 LAB — POCT URINE PREGNANCY: Preg Test, Ur: NEGATIVE

## 2017-08-29 NOTE — Telephone Encounter (Signed)
LMTCB

## 2017-08-29 NOTE — Telephone Encounter (Signed)
-----   Message from Erasmo DownerAngela M Bacigalupo, MD sent at 08/29/2017  2:25 PM EDT ----- No shin splints or fractures.  Plantar spurs bilaterally.  This is only of significance if having pain on bottoms of feet.  Monitor as discussed.  Erasmo DownerBacigalupo, Angela M, MD, MPH [facility] 08/29/2017 2:25 PM

## 2017-08-29 NOTE — Progress Notes (Signed)
Patient: Suzanne Boyd, Female    DOB: August 27, 1990, 27 y.o.   MRN: [id number] Visit Date: 08/29/2017  Today's Provider: Shirlee LatchAngela Bacigalupo, MD   I, Suzanne Boyd, CMA, am acting as scribe for Suzanne LatchAngela Bacigalupo, MD.  Chief Complaint  Patient presents with  . Establish Care   Subjective:    Establish Care Suzanne Boyd is a 27 y.o. female who presents today to establish care. She feels fairly well.  She is c/o dizzy spells that started about 2 months ago and has occurred twice. Was at work when this occurred (she is on her feet all day)  She states she noticed dizziness, hot flashes, diaphoresis, rapid heart rate. Denies syncope. She states the first time this occurred, she noticed chest pain. During the second occurrence, she noticed pain in her left arm. Denies N/V. States that SOB and chest tightness has occurred with this, as well as weakness. She is requesting to check for diabetes due to this, as well as a family H/O DM. She would also like to have her thyroid checked, as there is a family H/O thyroid disease.  Denies any stress/anxiety with episodes.  Pt also mentions shin pain that has been occurring "for my whole life". She states she had this evaluated when she was younger, and they advised her she had "growing pains". This has not changed since that evaluation.  Can occur with activity or at rest.  Feels like shins are splitting in 2.  Can occur in one or both legs.    Depression/Anxiety: followed by [facility]. Taking Lexapro, Abilify.  Feels like medication is not working and it keeps being increased and not helping.  She is looking for a new Psychiatrist.   She reports exercising none. She reports she is sleeping poorly.  Last pap- 07/04/2017- NIL; HPV negative. Followed by Gyn.  IUD in place.  She is worried that she could be pregnant though. Last Tdap- 02/26/2014 -----------------------------------------------------------------   Review of Systems   Constitutional: Positive for activity change, appetite change, diaphoresis and fatigue. Negative for chills, fever and unexpected weight change.  HENT: Negative.   Eyes: Negative.   Respiratory: Positive for chest tightness and shortness of breath. Negative for apnea, cough, choking, wheezing and stridor.   Cardiovascular: Positive for chest pain and palpitations. Negative for leg swelling.  Gastrointestinal: Negative.   Endocrine: Positive for polyphagia and polyuria. Negative for cold intolerance, heat intolerance and polydipsia.  Genitourinary: Negative.   Musculoskeletal: Positive for arthralgias, back pain, myalgias and neck pain. Negative for gait problem, joint swelling and neck stiffness.  Skin: Negative.   Allergic/Immunologic: Negative.   Neurological: Positive for dizziness, weakness, light-headedness and headaches. Negative for tremors, seizures, syncope, facial asymmetry, speech difficulty and numbness.  Hematological: Negative.   Psychiatric/Behavioral: Positive for agitation, behavioral problems, decreased concentration, dysphoric mood and sleep disturbance. Negative for confusion, hallucinations, self-injury and suicidal ideas. The patient is nervous/anxious. The patient is not hyperactive.     Social History      She  reports that she has never smoked. She has never used smokeless tobacco. She reports that she drinks alcohol. She reports that she does not use drugs.       Social History   Socioeconomic History  . Marital status: Married    Spouse name: Suzanne Boyd  . Number of children: 0  . Years of education: 1412  . Highest education level: High school graduate  Occupational History  . Occupation: Designer, industrial/productmanager retail  Social  Needs  . Financial resource strain: Not on file  . Food insecurity:    Worry: Not on file    Inability: Not on file  . Transportation needs:    Medical: Not on file    Non-medical: Not on file  Tobacco Use  . Smoking status: Never Smoker  .  Smokeless tobacco: Never Used  Substance and Sexual Activity  . Alcohol use: Yes    Alcohol/week: 0.0 oz    Comment: occasional- 1 drink per month  . Drug use: No  . Sexual activity: Yes    Partners: Male    Birth control/protection: IUD  Lifestyle  . Physical activity:    Days per week: 0 days    Minutes per session: 0 min  . Stress: To some extent  Relationships  . Social connections:    Talks on phone: Not on file    Gets together: Not on file    Attends religious service: Not on file    Active member of club or organization: Not on file    Attends meetings of clubs or organizations: Not on file    Relationship status: Not on file  Other Topics Concern  . Not on file  Social History Narrative  . Not on file    Past Medical History:  Diagnosis Date  . Anxiety   . Depression      Patient Active Problem List   Diagnosis Date Noted  . Family history of diabetes mellitus 08/29/2017  . Pre-syncope 08/29/2017  . Anxiety and depression 08/29/2017  . Pain in shin 08/29/2017  . BMI 30.0-30.9,adult 07/04/2017    Past Surgical History:  Procedure Laterality Date  . INTRAUTERINE DEVICE (IUD) INSERTION      Family History        Family Status  Relation Name Status  . MGM  (Not Specified)  . Mat Aunt MGA (Not Specified)  . Mat Aunt MGA (Not Specified)  . Mat Aunt MGA (Not Specified)  . Mat Aunt MGA (Not Specified)  . Mat Aunt MGA (Not Specified)  . Mat Aunt MGA (Not Specified)  . Mat Aunt MGA (Not Specified)  . Mat Uncle  (Not Specified)  . Mother  (Not Specified)  . Father  (Not Specified)  . MGF  (Not Specified)  . Neg Hx  (Not Specified)        Her family history includes Cancer in her maternal aunt, maternal aunt, maternal aunt, maternal aunt, maternal aunt, maternal aunt, maternal aunt, and maternal grandmother; Cancer - Colon in her maternal uncle; Depression in her maternal grandmother; Hyperlipidemia in her mother; Hypertension in her father and mother;  Thyroid disease in her father and maternal grandfather; Varicose Veins in her mother. There is no history of Ovarian cancer.      No Known Allergies   Current Outpatient Medications:  .  ARIPiprazole (ABILIFY) 5 MG tablet, Take 5 mg by mouth daily., Disp: , Rfl:  .  escitalopram (LEXAPRO) 10 MG tablet, Take 10 mg by mouth daily., Disp: , Rfl:   Current Facility-Administered Medications:  .  Levonorgestrel IUD 19.5 mg, 1 each, Intrauterine, Once, Schuman, Jaquelyn Bitter, MD   Patient Care Team: Erasmo Downer, MD as PCP - General (Family Medicine)      Objective:   Vitals: BP 108/80 (BP Location: Left Arm, Patient Position: Sitting, Cuff Size: Large)   Pulse 77   Temp (!) 97.5 F (36.4 C) (Oral)   Resp 16   Ht 5\' 3"  (1.6 m)  Wt 186 lb (84.4 kg)   SpO2 99%   BMI 32.95 kg/m    Vitals:   08/29/17 1015  BP: 108/80  Pulse: 77  Resp: 16  Temp: (!) 97.5 F (36.4 C)  TempSrc: Oral  SpO2: 99%  Weight: 186 lb (84.4 kg)  Height: 5\' 3"  (1.6 m)     Physical Exam  Constitutional: She is oriented to person, place, and time. She appears well-developed and well-nourished. No distress.  HENT:  Head: Normocephalic and atraumatic.  Right Ear: External ear normal.  Left Ear: External ear normal.  Nose: Nose normal.  Mouth/Throat: Oropharynx is clear and moist.  Eyes: Pupils are equal, round, and reactive to light. Conjunctivae and EOM are normal. No scleral icterus.  Neck: Neck supple. No thyromegaly present.  Cardiovascular: Normal rate, regular rhythm, normal heart sounds and intact distal pulses.  No murmur heard. Pulmonary/Chest: Effort normal and breath sounds normal. No respiratory distress. She has no wheezes. She has no rales.  Abdominal: Soft. Bowel sounds are normal. She exhibits no distension. There is no tenderness. There is no rebound and no guarding.  Musculoskeletal: She exhibits no edema or deformity.  Lymphadenopathy:    She has no cervical adenopathy.    Neurological: She is alert and oriented to person, place, and time.  Skin: Skin is warm and dry. Capillary refill takes less than 2 seconds. No rash noted.  Psychiatric: She has a normal mood and affect. Her behavior is normal.  Vitals reviewed.    Depression Screen PHQ 2/9 Scores 08/29/2017  PHQ - 2 Score 6  PHQ- 9 Score 21    EKG: NSR, no signs of ischemia   Results for orders placed or performed in visit on 08/29/17  POCT urine pregnancy  Result Value Ref Range   Preg Test, Ur Negative Negative     Assessment & Plan:    Problem List Items Addressed This Visit      Cardiovascular and Mediastinum   Pre-syncope - Primary    Most likely caused by vaso-vagal syncope 2/2 hypotension/dehydration/etc vs anxiety EKG benign Check TSH, CBC, CMP Return precautions discussed Stay well hydrated UPreg negative To keep log of episodes Consider cardiology referral if continues      Relevant Orders   Comprehensive metabolic panel   CBC w/Diff/Platelet   TSH     Other   Family history of diabetes mellitus    Will screen with fasting CBG      Relevant Orders   Lipid panel   Comprehensive metabolic panel   Anxiety and depression    Chronic, uncontrolled Followed by psych, but not happy with current control Will refer to psych per patient request      Relevant Orders   Ambulatory referral to Psychiatry   Pain in shin    Benign exam with good pulses and no deformity Describes shin splints, but strange that it occurs at rest Very low concern for vascular issue given duration of symptoms and age/health of patient Will obtain XRays given pain out of proportion with exam Trial of NSAIDs Could consider Ortho referral in the future if not improved      Relevant Orders   DG Tibia/Fibula Left   DG Tibia/Fibula Right    Other Visit Diagnoses    Screening for lipid disorders       Relevant Orders   Lipid panel   Amenorrhea       Relevant Orders   POCT urine pregnancy  (Completed)  Return in about 3 months (around 11/28/2017) for pre-syncope and shin pain f/u.   The entirety of the information documented in the History of Present Illness, Review of Systems and Physical Exam were personally obtained by me. Portions of this information were initially documented by Irving Burton Boyd, CMA and reviewed by me for thoroughness and accuracy.    Erasmo Downer, MD, MPH [facility] 08/29/2017 11:01 AM

## 2017-08-29 NOTE — Assessment & Plan Note (Signed)
Will screen with fasting CBG

## 2017-08-29 NOTE — Assessment & Plan Note (Signed)
Chronic, uncontrolled Followed by psych, but not happy with current control Will refer to psych per patient request

## 2017-08-29 NOTE — Assessment & Plan Note (Signed)
Benign exam with good pulses and no deformity Describes shin splints, but strange that it occurs at rest Very low concern for vascular issue given duration of symptoms and age/health of patient Will obtain XRays given pain out of proportion with exam Trial of NSAIDs Could consider Ortho referral in the future if not improved

## 2017-08-29 NOTE — Patient Instructions (Signed)
Near-Syncope °Near-syncope is when you suddenly become weak or dizzy, or you feel like you might pass out (faint). During an episode of near-syncope, you may: °· Feel dizzy or light-headed. °· Feel nauseous. °· See all white or all black in your field of vision. °· Have cold, clammy skin. ° °This condition is caused by a sudden decrease in blood flow to the brain. This decrease can result from various causes, but most of those causes are not dangerous. However, near-syncope can be a sign of a serious medical problem, so it is important to seek medical care. °If you fainted, get medical help right away.Call your local emergency services (911 in the [place] Do not drive yourself to the hospital. °Follow these instructions at home: °Pay attention to any changes in your symptoms. Take these actions to help with your condition: °· Have someone stay with you until you feel stable. °· Do not drive, use machinery, or play sports until your health care provider says it is okay. °· Keep all follow-up visits as told by your health care provider. This is important. °· If you start to feel like you might faint, lie down right away and raise (elevate) your feet above the level of your heart. Breathe deeply and steadily. Wait until all of the symptoms have passed. °· Drink enough fluid to keep your urine clear or pale yellow. °· If you are taking blood pressure or heart medicine, get up slowly and take several minutes to sit and then stand. This can reduce dizziness. °· Take over-the-counter and prescription medicines only as told by your health care provider. ° °Get help right away if: °· You have a severe headache. °· You have unusual pain in your chest, abdomen, or back. °· You are bleeding from your mouth or rectum, or you have black or tarry stool. °· You have a very fast or irregular heartbeat (palpitations). °· You faint once or repeatedly. °· You have a seizure. °· You are confused. °· You have trouble walking. °· You have  severe weakness. °· You have vision problems. °These symptoms may represent a serious problem that is an emergency. Do not wait to see if your symptoms will go away. Get medical help right away. Call your local emergency services (911 in the [place] Do not drive yourself to the hospital. °This information is not intended to replace advice given to you by your health care provider. Make sure you discuss any questions you have with your health care provider. °Document Released: 05/03/2005 Document Revised: 10/09/2015 Document Reviewed: 01/15/2015 °Elsevier Interactive Patient Education © 2017 [employer] ° °

## 2017-08-29 NOTE — Telephone Encounter (Signed)
Pt informed and voiced understanding of results. 

## 2017-08-29 NOTE — Assessment & Plan Note (Signed)
Most likely caused by vaso-vagal syncope 2/2 hypotension/dehydration/etc vs anxiety EKG benign Check TSH, CBC, CMP Return precautions discussed Stay well hydrated UPreg negative To keep log of episodes Consider cardiology referral if continues

## 2017-08-30 ENCOUNTER — Telehealth: Payer: Self-pay

## 2017-08-30 ENCOUNTER — Telehealth: Payer: Self-pay | Admitting: Family Medicine

## 2017-08-30 ENCOUNTER — Encounter: Payer: Self-pay | Admitting: Family Medicine

## 2017-08-30 LAB — CBC WITH DIFFERENTIAL/PLATELET
BASOS ABS: 0 10*3/uL (ref 0.0–0.2)
Basos: 0 %
EOS (ABSOLUTE): 0.2 10*3/uL (ref 0.0–0.4)
Eos: 2 %
HEMOGLOBIN: 11.4 g/dL (ref 11.1–15.9)
Hematocrit: 34.5 % (ref 34.0–46.6)
IMMATURE GRANS (ABS): 0 10*3/uL (ref 0.0–0.1)
IMMATURE GRANULOCYTES: 0 %
LYMPHS: 22 %
Lymphocytes Absolute: 2 10*3/uL (ref 0.7–3.1)
MCH: 28.3 pg (ref 26.6–33.0)
MCHC: 33 g/dL (ref 31.5–35.7)
MCV: 86 fL (ref 79–97)
MONOCYTES: 9 %
Monocytes Absolute: 0.8 10*3/uL (ref 0.1–0.9)
NEUTROS PCT: 67 %
Neutrophils Absolute: 6.1 10*3/uL (ref 1.4–7.0)
Platelets: 249 10*3/uL (ref 150–379)
RBC: 4.03 x10E6/uL (ref 3.77–5.28)
RDW: 14.1 % (ref 12.3–15.4)
WBC: 9.1 10*3/uL (ref 3.4–10.8)

## 2017-08-30 LAB — LIPID PANEL
CHOL/HDL RATIO: 3 ratio (ref 0.0–4.4)
Cholesterol, Total: 154 mg/dL (ref 100–199)
HDL: 51 mg/dL (ref >39)
LDL Calculated: 86 mg/dL (ref 0–99)
Triglycerides: 85 mg/dL (ref 0–149)
VLDL CHOLESTEROL CAL: 17 mg/dL (ref 5–40)

## 2017-08-30 LAB — COMPREHENSIVE METABOLIC PANEL
ALK PHOS: 85 IU/L (ref 39–117)
ALT: 9 IU/L (ref 0–32)
AST: 13 IU/L (ref 0–40)
Albumin/Globulin Ratio: 1.5 (ref 1.2–2.2)
Albumin: 4.1 g/dL (ref 3.5–5.5)
BUN/Creatinine Ratio: 18 (ref 9–23)
BUN: 11 mg/dL (ref 6–20)
Bilirubin Total: 0.3 mg/dL (ref 0.0–1.2)
CO2: 21 mmol/L (ref 20–29)
Calcium: 8.7 mg/dL (ref 8.7–10.2)
Chloride: 103 mmol/L (ref 96–106)
Creatinine, Ser: 0.61 mg/dL (ref 0.57–1.00)
GFR calc Af Amer: 144 mL/min/{1.73_m2} (ref >59)
GFR calc non Af Amer: 125 mL/min/{1.73_m2} (ref >59)
GLUCOSE: 87 mg/dL (ref 65–99)
Globulin, Total: 2.8 g/dL (ref 1.5–4.5)
Potassium: 4.4 mmol/L (ref 3.5–5.2)
Sodium: 139 mmol/L (ref 134–144)
TOTAL PROTEIN: 6.9 g/dL (ref 6.0–8.5)

## 2017-08-30 LAB — TSH: TSH: 0.722 u[IU]/mL (ref 0.450–4.500)

## 2017-08-30 NOTE — Telephone Encounter (Signed)
-----   Message from Erasmo DownerAngela M Bacigalupo, MD sent at 08/30/2017  8:17 AM EDT ----- Normal cholesterol, blood counts, thyroid function, electrolytes, blood sugar, kidney function, liver function.  Erasmo DownerBacigalupo, Angela M, MD, MPH [facility] 08/30/2017 8:17 AM

## 2017-08-30 NOTE — Telephone Encounter (Signed)
Left message advising pt. OK per DPR. 

## 2017-08-30 NOTE — Telephone Encounter (Signed)
Can refer to podiatry if patient wishes  Beryle FlockBacigalupo, Marzella SchleinAngela M, MD, MPH [facility] 08/30/2017 11:07 AM

## 2017-08-30 NOTE — Telephone Encounter (Signed)
lmtcb to ask about referral.

## 2017-08-30 NOTE — Telephone Encounter (Signed)
Pt states that she was told she hasplantar spurs bilaterally but no significance if  not having pain on bottoms of feet.She states she is having pain in both feet,worse in the left foot

## 2017-08-30 NOTE — Telephone Encounter (Signed)
Please review

## 2017-10-11 ENCOUNTER — Telehealth: Payer: Self-pay | Admitting: Family Medicine

## 2017-10-11 DIAGNOSIS — F329 Major depressive disorder, single episode, unspecified: Secondary | ICD-10-CM

## 2017-10-11 DIAGNOSIS — F419 Anxiety disorder, unspecified: Principal | ICD-10-CM

## 2017-10-11 NOTE — Telephone Encounter (Signed)
Pt needs a referral to [facility].  She was in April and talked with Dr. B about this. She thinks there was a referral and made but never an appt..  Pt's call back is  [phone]  Thanks teri

## 2017-10-11 NOTE — Telephone Encounter (Signed)
Please review

## 2017-10-12 NOTE — Telephone Encounter (Signed)
Pt advised.

## 2017-10-12 NOTE — Telephone Encounter (Signed)
Appears that patient did not answer calls from [facility].  Will re-place referral, but please advise patient that she will need to answer calls and schedule appt.  From referral note:  From: Elvina Mattes, CMA Sent: 10/05/2017   3:16 PM To: Erasmo Downer, MD Subject: referral clsoed                                This referral will be closed due to no patient contact.  Pt was called on  09-14-17 @ 3:37 and again on  09-19-17 @ 4:16 where a message was left with the husband to have patient call our office. Patient has not returned office calls so we are going to close referral request.   Beryle Flock Marzella Schlein, MD, MPH [facility] 10/12/2017 8:28 AM

## 2017-10-25 ENCOUNTER — Encounter: Payer: Self-pay | Admitting: Psychiatry

## 2017-10-25 ENCOUNTER — Ambulatory Visit (INDEPENDENT_AMBULATORY_CARE_PROVIDER_SITE_OTHER): Payer: Commercial Managed Care - PPO | Admitting: Psychiatry

## 2017-10-25 ENCOUNTER — Other Ambulatory Visit: Payer: Self-pay

## 2017-10-25 VITALS — BP 118/76 | HR 60 | Temp 97.9°F | Wt 180.0 lb

## 2017-10-25 DIAGNOSIS — F330 Major depressive disorder, recurrent, mild: Secondary | ICD-10-CM

## 2017-10-25 DIAGNOSIS — F5105 Insomnia due to other mental disorder: Secondary | ICD-10-CM | POA: Diagnosis not present

## 2017-10-25 DIAGNOSIS — F410 Panic disorder [episodic paroxysmal anxiety] without agoraphobia: Secondary | ICD-10-CM

## 2017-10-25 DIAGNOSIS — F411 Generalized anxiety disorder: Secondary | ICD-10-CM

## 2017-10-25 MED ORDER — SERTRALINE HCL 25 MG PO TABS: 25.0000 mg | ORAL_TABLET | Freq: Every day | ORAL | 0 refills | Status: DC
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
## 2017-10-25 NOTE — Patient Instructions (Signed)

## 2017-11-11 ENCOUNTER — Encounter: Payer: Self-pay | Admitting: Psychiatry

## 2017-11-11 ENCOUNTER — Ambulatory Visit (INDEPENDENT_AMBULATORY_CARE_PROVIDER_SITE_OTHER): Payer: Commercial Managed Care - PPO | Admitting: Psychiatry

## 2017-11-11 ENCOUNTER — Other Ambulatory Visit: Payer: Self-pay

## 2017-11-11 VITALS — BP 124/73 | HR 85 | Temp 98.0°F | Wt 179.8 lb

## 2017-11-11 DIAGNOSIS — F330 Major depressive disorder, recurrent, mild: Secondary | ICD-10-CM | POA: Diagnosis not present

## 2017-11-11 DIAGNOSIS — F411 Generalized anxiety disorder: Secondary | ICD-10-CM | POA: Diagnosis not present

## 2017-11-11 DIAGNOSIS — F410 Panic disorder [episodic paroxysmal anxiety] without agoraphobia: Secondary | ICD-10-CM

## 2017-11-11 MED ORDER — HYDROXYZINE HCL 10 MG PO TABS: 10.0000 mg | ORAL_TABLET | Freq: Three times a day (TID) | ORAL | 0 refills | Status: DC | PRN

## 2017-11-11 MED ORDER — BUPROPION HCL ER (XL) 300 MG PO TB24: 300.0000 mg | ORAL_TABLET | Freq: Every day | ORAL | 0 refills | Status: DC

## 2017-11-11 MED ORDER — SERTRALINE HCL 50 MG PO TABS: 50.0000 mg | ORAL_TABLET | Freq: Every day | ORAL | 1 refills | Status: DC

## 2017-11-11 MED ORDER — BUPROPION HCL ER (XL) 150 MG PO TB24: 150.0000 mg | ORAL_TABLET | Freq: Every day | ORAL | 0 refills | Status: DC

## 2017-11-11 NOTE — Patient Instructions (Signed)
Hydroxyzine capsules or tablets What is this medicine? HYDROXYZINE (hye DROX i zeen) is an antihistamine. This medicine is used to treat allergy symptoms. It is also used to treat anxiety and tension. This medicine can be used with other medicines to induce sleep before surgery. This medicine may be used for other purposes; ask your health care provider or pharmacist if you have questions. COMMON BRAND NAME(S): ANX, Atarax, Rezine, Vistaril What should I tell my health care provider before I take this medicine? They need to know if you have any of these conditions: -any chronic illness -difficulty passing urine -glaucoma -heart disease -kidney disease -liver disease -lung disease -an unusual or allergic reaction to hydroxyzine, cetirizine, other medicines, foods, dyes, or preservatives -pregnant or trying to get pregnant -breast-feeding How should I use this medicine? Take this medicine by mouth with a full glass of water. Follow the directions on the prescription label. You may take this medicine with food or on an empty stomach. Take your medicine at regular intervals. Do not take your medicine more often than directed. Talk to your pediatrician regarding the use of this medicine in children. Special care may be needed. While this drug may be prescribed for children as young as 6 years of age for selected conditions, precautions do apply. Patients over 65 years old may have a stronger reaction and need a smaller dose. Overdosage: If you think you have taken too much of this medicine contact a poison control center or emergency room at once. NOTE: This medicine is only for you. Do not share this medicine with others. What if I miss a dose? If you miss a dose, take it as soon as you can. If it is almost time for your next dose, take only that dose. Do not take double or extra doses. What may interact with this medicine? -alcohol -barbiturate medicines for sleep or seizures -medicines for  colds, allergies -medicines for depression, anxiety, or emotional disturbances -medicines for pain -medicines for sleep -muscle relaxants This list may not describe all possible interactions. Give your health care provider a list of all the medicines, herbs, non-prescription drugs, or dietary supplements you use. Also tell them if you smoke, drink alcohol, or use illegal drugs. Some items may interact with your medicine. What should I watch for while using this medicine? Tell your doctor or health care professional if your symptoms do not improve. You may get drowsy or dizzy. Do not drive, use machinery, or do anything that needs mental alertness until you know how this medicine affects you. Do not stand or sit up quickly, especially if you are an older patient. This reduces the risk of dizzy or fainting spells. Alcohol may interfere with the effect of this medicine. Avoid alcoholic drinks. Your mouth may get dry. Chewing sugarless gum or sucking hard candy, and drinking plenty of water may help. Contact your doctor if the problem does not go away or is severe. This medicine may cause dry eyes and blurred vision. If you wear contact lenses you may feel some discomfort. Lubricating drops may help. See your eye doctor if the problem does not go away or is severe. If you are receiving skin tests for allergies, tell your doctor you are using this medicine. What side effects may I notice from receiving this medicine? Side effects that you should report to your doctor or health care professional as soon as possible: -fast or irregular heartbeat -difficulty passing urine -seizures -slurred speech or confusion -tremor Side effects that   usually do not require medical attention (report to your doctor or health care professional if they continue or are bothersome): -constipation -drowsiness -fatigue -headache -stomach upset This list may not describe all possible side effects. Call your doctor for  medical advice about side effects. You may report side effects to FDA at 1-800-FDA-1088. Where should I keep my medicine? Keep out of the reach of children. Store at room temperature between 15 and 30 degrees C (59 and 86 degrees F). Keep container tightly closed. Throw away any unused medicine after the expiration date. NOTE: This sheet is a summary. It may not cover all possible information. If you have questions about this medicine, talk to your doctor, pharmacist, or health care provider.  2018 Elsevier/Gold Standard (2007-09-15 14:50:59)  

## 2017-11-11 NOTE — Progress Notes (Signed)
BH MD OP Progress Note  11/11/2017 12:28 PM Suzanne Boyd  MRN:  [id number]  Chief Complaint: ' I am here for follow up." Chief Complaint    Follow-up; Medication Refill     HPI: Suzanne Boyd is a 27 year old Caucasian female, employed, married, lives in [place], has a history of depression, anxiety, panic attacks, presented to the clinic today for a follow-up visit.  Patient today reports she is tolerating her Wellbutrin and Zoloft well.  Patient continues to have some anxiety symptoms especially when she goes to work.  She reports she has been trying to cope however there are days when she feels very restless.  Patient is aware that Zoloft needs time to work and build up in her system.  She however agrees to increase the dosage today.  Patient reports sleep is good.  Patient denies any suicidality.  Patient denies any perceptual disturbances.  She will start psychotherapy with Ms. Nolon RodNicole Peacock in July.   Visit Diagnosis:    ICD-10-CM   1. MDD (major depressive disorder), recurrent episode, mild (HCC) F33.0   2. GAD (generalized anxiety disorder) F41.1   3. Panic attacks F41.0     Past Psychiatric History: Have reviewed past psychiatric history from my progress note on 10/25/2017.  Past trials of Paxil, Lexapro, Abilify, Wellbutrin.  Past Medical History:  Past Medical History:  Diagnosis Date  . Anxiety   . Depression     Past Surgical History:  Procedure Laterality Date  . INTRAUTERINE DEVICE (IUD) INSERTION      Family Psychiatric History: Reviewed family psychiatric history from my progress note on 10/25/2017.  Family History:  Family History  Problem Relation Age of Onset  . Cancer Maternal Grandmother        brain  . Depression Maternal Grandmother   . Cancer Maternal Aunt        breast  . Anxiety disorder Maternal Aunt   . Cancer Maternal Aunt        breast  . Cancer Maternal Aunt        breast  . Cancer Maternal Aunt        breast  . Cancer Maternal Aunt         breast  . Cancer Maternal Aunt        breast  . Cancer Maternal Aunt        breast  . Cancer - Colon Maternal Uncle   . Hyperlipidemia Mother   . Hypertension Mother   . Varicose Veins Mother   . Hypertension Father   . Thyroid disease Father   . Thyroid disease Maternal Grandfather   . Ovarian cancer Neg Hx    Substance abuse history: Cannabis use occasional  Social History: She is married.  She is employed at [employer].  She [place].  She has a stepdaughter who comes to stay with them on and off. Social History   Socioeconomic History  . Marital status: Married    Spouse name: Weston Brassick  . Number of children: 0  . Years of education: 5512  . Highest education level: High school graduate  Occupational History  . 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 Financial resource strain: Not hard at all  . Food insecurity:    Worry: Never true    Inability: Never true  . Transportation needs:    Medical: No    Non-medical: No  Tobacco Use  . Smoking status: Never Smoker  .  Smokeless tobacco: Never Used  Substance and Sexual Activity  . Alcohol use: Yes    Alcohol/week: 0.0 oz    Comment: occasional- 1 drink per month  . Drug use: Yes    Types: Marijuana    Comment: last used 3 weeks ago  . Sexual activity: Yes    Partners: Male    Birth control/protection: IUD  Lifestyle  . Physical activity:    Days per week: 0 days    Minutes per session: 0 min  . Stress: To some extent  Relationships  . Social connections:    Talks on phone: Once a week    Gets together: Once a week    Attends religious service: Never    Active member of club or organization: No    Attends meetings of clubs or organizations: Never    Relationship status: Married  Other Topics Concern  . Not on file  Social History Narrative  . Not on file    Allergies: No Known Allergies  Metabolic Disorder Labs: No results found for: HGBA1C,  MPG No results found for: PROLACTIN Lab Results  Component Value Date   CHOL 154 08/29/2017   TRIG 85 08/29/2017   HDL 51 08/29/2017   CHOLHDL 3.0 08/29/2017   LDLCALC 86 08/29/2017   Lab Results  Component Value Date   TSH 0.722 08/29/2017    Therapeutic Level Labs: No results found for: LITHIUM No results found for: VALPROATE No components found for:  CBMZ  Current Medications: Current Outpatient Medications  Medication Sig Dispense Refill  . buPROPion (WELLBUTRIN XL) 150 MG 24 hr tablet Take 1 tablet (150 mg total) by mouth daily. To be combined with 300 mg 90 tablet 0  . buPROPion (WELLBUTRIN XL) 300 MG 24 hr tablet Take 1 tablet (300 mg total) by mouth daily. To be combined with 150 mg 90 tablet 0  . hydrOXYzine (ATARAX/VISTARIL) 10 MG tablet Take 1-2 tablets (10-20 mg total) by mouth 3 (three) times daily as needed for anxiety (only for severe anxiety). 180 tablet 0  . sertraline (ZOLOFT) 50 MG tablet Take 1 tablet (50 mg total) by mouth daily. 30 tablet 1   Current Facility-Administered Medications  Medication Dose Route Frequency Provider Last Rate Last Dose  . Levonorgestrel IUD 19.5 mg  1 each Intrauterine Once Schuman, Jaquelyn Bitter, MD         Musculoskeletal: Strength & Muscle Tone: within normal limits Gait & Station: normal Patient leans: N/A  Psychiatric Specialty Exam: Review of Systems  Psychiatric/Behavioral: The patient is nervous/anxious.   All other systems reviewed and are negative.   Blood pressure 124/73, pulse 85, temperature 98 F (36.7 C), temperature source Oral, weight 179 lb 12.8 oz (81.6 kg), last menstrual period 10/25/2017.Body mass index is 31.85 kg/m.  General Appearance: Casual  Eye Contact:  Fair  Speech:  Normal Rate  Volume:  Normal  Mood:  Anxious  Affect:  Appropriate  Thought Process:  Goal Directed and Descriptions of Associations: Intact  Orientation:  Full (Time, Place, and Person)  Thought Content: Logical    Suicidal Thoughts:  No  Homicidal Thoughts:  No  Memory:  Immediate;   Fair Recent;   Fair Remote;   Fair  Judgement:  Fair  Insight:  Fair  Psychomotor Activity:  Normal  Concentration:  Concentration: Fair and Attention Span: Fair  Recall:  [employer] of Knowledge: Fair  Language: Fair  Akathisia:  No  Handed:  Right  AIMS (if indicated): na  Assets:  Communication Skills Desire for Improvement Social Support  ADL's:  Intact  Cognition: WNL  Sleep:  Fair   Screenings: PHQ2-9     Office Visit from 08/29/2017 in [facility]  PHQ-2 Total Score  6  PHQ-9 Total Score  21       Assessment and Plan: Zianna is a 27 year old Caucasian female who has a history of depression, anxiety, insomnia, presented to the clinic today for a follow-up visit.  Patient today reports she is tolerating the medication well however does have anxiety symptoms ongoing.  She does have psychosocial stressors of relationship struggles and is biologically predisposed given her family history of mental health problems.  Discussed medication readjustment as well as she has been referred for psychotherapy.  Plan For MDD Continue Wellbutrin XL 450 mg p.o. daily. Increase Zoloft to 50 mg p.o. daily.  For GAD Increase Zoloft to 50 mg p.o. Daily  Panic attacks Zoloft 50 mg p.o. Daily Hydroxyzine 10-20 mg p.o. 3 times daily as needed  Insomnia Discussed sleep hygiene techniques. Melatonin as needed.  Follow-up in clinic in 6 weeks.  Continue psychotherapy with Ms. Peacock.  More than 50 % of the time was spent for psychoeducation and supportive psychotherapy and care coordination.  This note was generated in part or whole with voice recognition software. Voice recognition is usually quite accurate but there are transcription errors that can and very often do occur. I apologize for any typographical errors that were not detected and corrected.          Jomarie Longs,  MD 11/11/2017, 12:28 PM

## 2017-11-28 ENCOUNTER — Ambulatory Visit (INDEPENDENT_AMBULATORY_CARE_PROVIDER_SITE_OTHER): Payer: Commercial Managed Care - PPO | Admitting: Family Medicine

## 2017-11-28 ENCOUNTER — Encounter: Payer: Self-pay | Admitting: Family Medicine

## 2017-11-28 VITALS — BP 108/68 | HR 65 | Temp 97.8°F | Resp 16 | Wt 175.0 lb

## 2017-11-28 DIAGNOSIS — F329 Major depressive disorder, single episode, unspecified: Secondary | ICD-10-CM | POA: Diagnosis not present

## 2017-11-28 DIAGNOSIS — R55 Syncope and collapse: Secondary | ICD-10-CM

## 2017-11-28 DIAGNOSIS — M79669 Pain in unspecified lower leg: Secondary | ICD-10-CM | POA: Diagnosis not present

## 2017-11-28 DIAGNOSIS — F419 Anxiety disorder, unspecified: Secondary | ICD-10-CM

## 2017-11-28 MED ORDER — BUPROPION HCL ER (XL) 300 MG PO TB24: 300.0000 mg | ORAL_TABLET | Freq: Every day | ORAL | 0 refills | Status: DC

## 2017-11-28 NOTE — Assessment & Plan Note (Signed)
Now resolved with no episodes since last office visit As stated before, most likely caused by vasovagal syncope secondary to low normal blood pressure and dehydration Labs were benign Advised to stay well-hydrated Discussed return precautions

## 2017-11-28 NOTE — Assessment & Plan Note (Signed)
Resolved Could have been related to dehydration like her presyncope Benign exam with good pulses and no deformity X-rays benign Return precautions discussed

## 2017-11-28 NOTE — Assessment & Plan Note (Signed)
Chronic Followed by psychiatry Starting therapy in a few weeks Refilled Wellbutrin today

## 2017-11-28 NOTE — Progress Notes (Signed)
Patient: Suzanne LennertBrandi Keeny Female    DOB: [birth year]   27 y.o.   MRN: [id number] Visit Date: 11/28/2017  Today's Provider: Shirlee LatchAngela Bacigalupo, MD   I, Joslyn HyEmily Ratchford, CMA, am acting as scribe for Shirlee LatchAngela Bacigalupo, MD.  Chief Complaint  Patient presents with  . Near Syncope    FU  . Shin Splints    FU   Subjective:    HPI     Follow up for Presyncope  The patient was last seen for this 3 months ago. This was believed to be vasovagal secondary to hypotension/dehydration. Changes made at last visit include advising pt to keep a log of episodes.  She reports good compliance with treatment. She feels that condition is Improved. She has not had another episode since LOV.  ------------------------------------------------------------------------------------   Follow up for Shin Pain  The patient was last seen for this 3 months ago. Changes made at last visit include ordering imaging, which was WNL.  She feels that condition is Improved.  ------------------------------------------------------------------------------------  Pt needs a refill of Wellbutrin 300 mg. Her anxiety and depression is F/B by psych, but pt states they have not refilled this.  She is followed Dr Elna BreslowEappen.  She will be starting therapy in a few weeks.  She is doing well on Zoloft and Wellbutrin.  No Known Allergies   Current Outpatient Medications:  .  buPROPion (WELLBUTRIN XL) 150 MG 24 hr tablet, Take 1 tablet (150 mg total) by mouth daily. To be combined with 300 mg, Disp: 90 tablet, Rfl: 0 .  buPROPion (WELLBUTRIN XL) 300 MG 24 hr tablet, Take 1 tablet (300 mg total) by mouth daily. To be combined with 150 mg, Disp: 90 tablet, Rfl: 0 .  hydrOXYzine (ATARAX/VISTARIL) 10 MG tablet, Take 1-2 tablets (10-20 mg total) by mouth 3 (three) times daily as needed for anxiety (only for severe anxiety)., Disp: 180 tablet, Rfl: 0 .  sertraline (ZOLOFT) 50 MG tablet, Take 1 tablet (50 mg total) by mouth daily.,  Disp: 30 tablet, Rfl: 1  Current Facility-Administered Medications:  .  Levonorgestrel IUD 19.5 mg, 1 each, Intrauterine, Once, Schuman, Christanna R, MD  Review of Systems  Constitutional: Negative for activity change, appetite change, chills, diaphoresis, fatigue, fever and unexpected weight change.  Respiratory: Negative for shortness of breath.   Cardiovascular: Negative for chest pain, palpitations and leg swelling.  Psychiatric/Behavioral: Negative for dysphoric mood. The patient is not nervous/anxious.     Social History   Tobacco Use  . Smoking status: Never Smoker  . Smokeless tobacco: Never Used  Substance Use Topics  . Alcohol use: Yes    Alcohol/week: 0.0 oz    Comment: occasional- 1 drink per month   Objective:   BP 108/68 (BP Location: Left Arm, Patient Position: Sitting, Cuff Size: Normal)   Pulse 65   Temp 97.8 F (36.6 C) (Oral)   Resp 16   Wt 175 lb (79.4 kg)   SpO2 99%   BMI 31.00 kg/m  Vitals:   11/28/17 1026  BP: 108/68  Pulse: 65  Resp: 16  Temp: 97.8 F (36.6 C)  TempSrc: Oral  SpO2: 99%  Weight: 175 lb (79.4 kg)     Physical Exam  Constitutional: She is oriented to person, place, and time. She appears well-developed and well-nourished. No distress.  HENT:  Head: Normocephalic and atraumatic.  Eyes: Pupils are equal, round, and reactive to light. Conjunctivae are normal. No scleral icterus.  Neck: Neck supple. No thyromegaly  present.  Cardiovascular: Normal rate, regular rhythm, normal heart sounds and intact distal pulses.  No murmur heard. Pulmonary/Chest: Effort normal and breath sounds normal. No respiratory distress. She has no wheezes. She has no rales.  Musculoskeletal: She exhibits no edema or deformity.  No shin TTP  Lymphadenopathy:    She has no cervical adenopathy.  Neurological: She is alert and oriented to person, place, and time.  Skin: Skin is warm and dry. Capillary refill takes less than 2 seconds. No rash noted.    Psychiatric: She has a normal mood and affect. Her behavior is normal.       Assessment & Plan:     Problem List Items Addressed This Visit      Cardiovascular and Mediastinum   Pre-syncope - Primary    Now resolved with no episodes since last office visit As stated before, most likely caused by vasovagal syncope secondary to low normal blood pressure and dehydration Labs were benign Advised to stay well-hydrated Discussed return precautions        Other   Anxiety and depression    Chronic Followed by psychiatry Starting therapy in a few weeks Refilled Wellbutrin today      Relevant Medications   buPROPion (WELLBUTRIN XL) 300 MG 24 hr tablet   Pain in shin    Resolved Could have been related to dehydration like her presyncope Benign exam with good pulses and no deformity X-rays benign Return precautions discussed          Return in about 1 year (around 11/29/2018) for CPE.   The entirety of the information documented in the History of Present Illness, Review of Systems and Physical Exam were personally obtained by me. Portions of this information were initially documented by Irving Burton Ratchford, CMA and reviewed by me for thoroughness and accuracy.    Erasmo Downer, MD, MPH [facility] 11/28/2017 10:48 AM

## 2017-12-04 ENCOUNTER — Other Ambulatory Visit: Payer: Self-pay | Admitting: Psychiatry

## 2017-12-07 ENCOUNTER — Ambulatory Visit: Payer: Commercial Managed Care - PPO | Admitting: Licensed Clinical Social Worker

## 2017-12-09 ENCOUNTER — Other Ambulatory Visit: Payer: Self-pay | Admitting: Psychiatry

## 2017-12-19 ENCOUNTER — Telehealth: Payer: Self-pay | Admitting: Psychiatry

## 2017-12-19 ENCOUNTER — Other Ambulatory Visit: Payer: Self-pay | Admitting: Psychiatry

## 2017-12-19 MED ORDER — SERTRALINE HCL 50 MG PO TABS: 50.0000 mg | ORAL_TABLET | Freq: Every day | ORAL | 1 refills | Status: DC

## 2017-12-19 NOTE — Telephone Encounter (Signed)
zoloft sent to pharmacy 

## 2017-12-23 ENCOUNTER — Encounter: Payer: Self-pay | Admitting: Psychiatry

## 2017-12-23 ENCOUNTER — Other Ambulatory Visit: Payer: Self-pay

## 2017-12-23 ENCOUNTER — Ambulatory Visit (INDEPENDENT_AMBULATORY_CARE_PROVIDER_SITE_OTHER): Payer: Commercial Managed Care - PPO | Admitting: Psychiatry

## 2017-12-23 VITALS — BP 114/70 | HR 54 | Temp 97.6°F | Wt 178.8 lb

## 2017-12-23 DIAGNOSIS — F410 Panic disorder [episodic paroxysmal anxiety] without agoraphobia: Secondary | ICD-10-CM

## 2017-12-23 DIAGNOSIS — F330 Major depressive disorder, recurrent, mild: Secondary | ICD-10-CM | POA: Diagnosis not present

## 2017-12-23 DIAGNOSIS — F5105 Insomnia due to other mental disorder: Secondary | ICD-10-CM | POA: Diagnosis not present

## 2017-12-23 DIAGNOSIS — F411 Generalized anxiety disorder: Secondary | ICD-10-CM

## 2017-12-23 MED ORDER — SERTRALINE HCL 50 MG PO TABS: 50.0000 mg | ORAL_TABLET | Freq: Every evening | ORAL | 1 refills | Status: DC

## 2017-12-23 NOTE — Progress Notes (Signed)
BH MD OP Progress Note  12/23/2017 12:22 PM Suzanne Boyd  MRN:  [id number]  Chief Complaint: ' I am here for follow up.' Chief Complaint    Follow-up; Medication Refill     HPI: Suzanne Boyd is a 27 yr old Caucasian female, employed, married, lives in [place], has a history of depression, anxiety, panic attacks, presented to the clinic today for a follow-up visit.  Patient today reports she is not depressed as she used to before.  She however continues to feel tired all the time.  Patient reports sleep is fair.  She however does report she snores at night.  Patient reports she has been taking the Wellbutrin and Zoloft and does not think the medications are contributing to her tiredness.  Patient denies any suicidality.  Patient denies any perceptual disturbances.  Patient continues to have support from her boyfriend.  She reports work is going well. Visit Diagnosis:    ICD-10-CM   1. MDD (major depressive disorder), recurrent episode, mild (HCC) F33.0   2. GAD (generalized anxiety disorder) F41.1   3. Panic attacks F41.0   4. Insomnia due to mental condition F51.05     Past Psychiatric History: Have reviewed past psychiatric history from my progress note on 10/25/2017.  Past trials of Paxil, Lexapro, Abilify, Wellbutrin.  Past Medical History:  Past Medical History:  Diagnosis Date  . Anxiety   . Depression     Past Surgical History:  Procedure Laterality Date  . INTRAUTERINE DEVICE (IUD) INSERTION      Family Psychiatric History: Reviewed family psychiatric history from my progress note on 10/25/2017.  Family History:  Family History  Problem Relation Age of Onset  . Cancer Maternal Grandmother        brain  . Depression Maternal Grandmother   . Cancer Maternal Aunt        breast  . Anxiety disorder Maternal Aunt   . Cancer Maternal Aunt        breast  . Cancer Maternal Aunt        breast  . Cancer Maternal Aunt        breast  . Cancer Maternal Aunt        breast   . Cancer Maternal Aunt        breast  . Cancer Maternal Aunt        breast  . Cancer - Colon Maternal Uncle   . Hyperlipidemia Mother   . Hypertension Mother   . Varicose Veins Mother   . Hypertension Father   . Thyroid disease Father   . Thyroid disease Maternal Grandfather   . Ovarian cancer Neg Hx     Social History: Reviewed social history from my progress note on 10/25/2017. Social History   Socioeconomic History  . Marital status: Married    Spouse name: Weston Brassick  . Number of children: 0  . Years of education: 3912  . Highest education level: High school graduate  Occupational History  . 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 Financial resource strain: Not hard at all  . Food insecurity:    Worry: Never true    Inability: Never true  . Transportation needs:    Medical: No    Non-medical: No  Tobacco Use  . Smoking status: Never Smoker  . Smokeless tobacco: Never Used  Substance and Sexual Activity  . Alcohol use: Yes    Alcohol/week: 0.0 standard drinks    Comment: occasional- 1 drink  per month  . Drug use: Yes    Types: Marijuana    Comment: last used 3 weeks ago  . Sexual activity: Yes    Partners: Male    Birth control/protection: IUD  Lifestyle  . Physical activity:    Days per week: 0 days    Minutes per session: 0 min  . Stress: To some extent  Relationships  . Social connections:    Talks on phone: Once a week    Gets together: Once a week    Attends religious service: Never    Active member of club or organization: No    Attends meetings of clubs or organizations: Never    Relationship status: Married  Other Topics Concern  . Not on file  Social History Narrative  . Not on file    Allergies: No Known Allergies  Metabolic Disorder Labs: No results found for: HGBA1C, MPG No results found for: PROLACTIN Lab Results  Component Value Date   CHOL 154 08/29/2017   TRIG 85 08/29/2017   HDL 51 08/29/2017   CHOLHDL  3.0 08/29/2017   LDLCALC 86 08/29/2017   Lab Results  Component Value Date   TSH 0.722 08/29/2017    Therapeutic Level Labs: No results found for: LITHIUM No results found for: VALPROATE No components found for:  CBMZ  Current Medications: Current Outpatient Medications  Medication Sig Dispense Refill  . buPROPion (WELLBUTRIN XL) 150 MG 24 hr tablet Take 1 tablet (150 mg total) by mouth daily. To be combined with 300 mg 90 tablet 0  . buPROPion (WELLBUTRIN XL) 300 MG 24 hr tablet Take 1 tablet (300 mg total) by mouth daily. To be combined with 150 mg 90 tablet 0  . hydrOXYzine (ATARAX/VISTARIL) 10 MG tablet TAKE 1-2 TABLETS BY MOUTH 3 TIMES DAILY AS NEEDED FOR ANXIETY (ONLY FOR SEVERE ANXIETY). 180 tablet 0  . sertraline (ZOLOFT) 50 MG tablet Take 1 tablet (50 mg total) by mouth every evening. Pt has supplies 90 tablet 1   Current Facility-Administered Medications  Medication Dose Route Frequency Provider Last Rate Last Dose  . Levonorgestrel IUD 19.5 mg  1 each Intrauterine Once Schuman, Jaquelyn Bitter, MD         Musculoskeletal: Strength & Muscle Tone: within normal limits Gait & Station: normal Patient leans: N/A  Psychiatric Specialty Exam: Review of Systems  Constitutional: Positive for malaise/fatigue.  Psychiatric/Behavioral: Positive for depression (improving ).  All other systems reviewed and are negative.   Blood pressure 114/70, pulse (!) 54, temperature 97.6 F (36.4 C), temperature source Oral, weight 178 lb 12.8 oz (81.1 kg), last menstrual period 10/23/2017.Body mass index is 31.67 kg/m.  General Appearance: Casual  Eye Contact:  Fair  Speech:  Clear and Coherent  Volume:  Normal  Mood:  Dysphoric improving  Affect:  Congruent  Thought Process:  Goal Directed and Descriptions of Associations: Intact  Orientation:  Full (Time, Place, and Person)  Thought Content: Logical   Suicidal Thoughts:  No  Homicidal Thoughts:  No  Memory:  Immediate;    Fair Recent;   Fair Remote;   Fair  Judgement:  Fair  Insight:  Fair  Psychomotor Activity:  Normal  Concentration:  Concentration: Fair and Attention Span: Fair  Recall:  [employer] of Knowledge: Fair  Language: Fair  Akathisia:  No  Handed:  Right  AIMS (if indicated): na  Assets:  Communication Skills Desire for Improvement Social Support  ADL's:  Intact  Cognition: WNL  Sleep:  restless   Screenings: PHQ2-9     Office Visit from 08/29/2017 in [facility]  PHQ-2 Total Score  6  PHQ-9 Total Score  21       Assessment and Plan: Suzanne Boyd is a 27 year old Caucasian female who has a history of depression, anxiety, insomnia, presented to the clinic today for a follow-up visit.  Patient reports she is compliant on her medications.  She continues to have some lethargy and tiredness during the day.  She otherwise reports work is going well and has good support system from her family.  Plan MDD Continue Wellbutrin XL 450 mg p.o. daily Continue Zoloft 50 mg p.o. daily however discussed with patient to change the dosage time to evening to see if that will help with tiredness during the day.  For GAD Zoloft 50 mg p.o. daily  For panic attacks Zoloft 50 mg p.o. daily Hydroxyzine 10-20 mg p.o. 3 times daily as needed  For insomnia Completed an Epworth sleepiness scale and scored 9.  It is unlikely that she has obstructive sleep apnea even though she reports snoring at night. We will continue to monitor closely. Melatonin as needed  Discussed with patient to make lifestyle changes, eat healthy meals at least 3 times a day, exercise regularly and so on.  Follow-up in clinic in 4 weeks.  More than 50 % of the time was spent for psychoeducation and supportive psychotherapy and care coordination.  This note was generated in part or whole with voice recognition software. Voice recognition is usually quite accurate but there are transcription errors that can and very  often do occur. I apologize for any typographical errors that were not detected and corrected.       Jomarie Longs, MD 12/23/2017, 12:22 PM

## 2018-01-20 ENCOUNTER — Ambulatory Visit: Payer: Commercial Managed Care - PPO | Admitting: Psychiatry

## 2018-01-26 ENCOUNTER — Ambulatory Visit (INDEPENDENT_AMBULATORY_CARE_PROVIDER_SITE_OTHER): Payer: Commercial Managed Care - PPO | Admitting: Psychiatry

## 2018-01-26 ENCOUNTER — Other Ambulatory Visit: Payer: Self-pay

## 2018-01-26 ENCOUNTER — Encounter: Payer: Self-pay | Admitting: Psychiatry

## 2018-01-26 VITALS — BP 110/73 | HR 68 | Temp 98.1°F | Wt 182.2 lb

## 2018-01-26 DIAGNOSIS — F330 Major depressive disorder, recurrent, mild: Secondary | ICD-10-CM

## 2018-01-26 DIAGNOSIS — F410 Panic disorder [episodic paroxysmal anxiety] without agoraphobia: Secondary | ICD-10-CM | POA: Diagnosis not present

## 2018-01-26 DIAGNOSIS — F5105 Insomnia due to other mental disorder: Secondary | ICD-10-CM | POA: Diagnosis not present

## 2018-01-26 DIAGNOSIS — F411 Generalized anxiety disorder: Secondary | ICD-10-CM

## 2018-01-26 MED ORDER — BUPROPION HCL ER (XL) 300 MG PO TB24: 300.0000 mg | ORAL_TABLET | Freq: Every day | ORAL | 0 refills | Status: DC

## 2018-01-26 MED ORDER — SERTRALINE HCL 50 MG PO TABS: 75.0000 mg | ORAL_TABLET | Freq: Every evening | ORAL | 1 refills | Status: DC

## 2018-01-26 NOTE — Progress Notes (Signed)
BH MD OP Progress Note  01/26/2018 12:42 PM Suzanne Boyd  MRN:  [id number]  Chief Complaint: ' I am here for follow up." Chief Complaint    Follow-up; Medication Refill     HPI: Suzanne Boyd is a 27 year old Caucasian female, employed, married, lives in [place], has a history of depression, anxiety, panic attacks, presented to the clinic today for a follow-up visit.  Patient reports she has noticed some irritability since the past few weeks.  She reports her depressive symptoms may have improved.  She denies feeling tired during the day like she used to before.  She reports there is an improvement in that.  She is tolerating the Zoloft well.  She denies any side effects at this time.  She reports sleep is improved.  She denies any significant panic attacks.  She however does have some anxiety symptoms on and off more so because of her situational stressors.  She reports she is in the process of moving to [place] with her boyfriend.  She denies any suicidality.  She denies any perceptual disturbances.   Visit Diagnosis:    ICD-10-CM   1. MDD (major depressive disorder), recurrent episode, mild (HCC) F33.0   2. GAD (generalized anxiety disorder) F41.1   3. Panic attacks F41.0   4. Insomnia due to mental condition F51.05     Past Psychiatric History: I have reviewed past psychiatric history from my progress note on 10/25/2017.  Past trials of Paxil, Lexapro, Abilify, Wellbutrin  Past Medical History:  Past Medical History:  Diagnosis Date  . Anxiety   . Depression     Past Surgical History:  Procedure Laterality Date  . INTRAUTERINE DEVICE (IUD) INSERTION      Family Psychiatric History: I have reviewed family psychiatric history from my progress note on 10/25/2017  Family History:  Family History  Problem Relation Age of Onset  . Cancer Maternal Grandmother        brain  . Depression Maternal Grandmother   . Cancer Maternal Aunt        breast  . Anxiety disorder Maternal  Aunt   . Cancer Maternal Aunt        breast  . Cancer Maternal Aunt        breast  . Cancer Maternal Aunt        breast  . Cancer Maternal Aunt        breast  . Cancer Maternal Aunt        breast  . Cancer Maternal Aunt        breast  . Cancer - Colon Maternal Uncle   . Hyperlipidemia Mother   . Hypertension Mother   . Varicose Veins Mother   . Hypertension Father   . Thyroid disease Father   . Thyroid disease Maternal Grandfather   . Ovarian cancer Neg Hx     Social History: I have reviewed social history from my progress note on 10/25/2017 Social History   Socioeconomic History  . Marital status: Married    Spouse name: [name]  . Number of children: 0  . Years of education: 16  . Highest education level: High school graduate  Occupational History  . Occupation: Air cabin crew    Comment: full time  Social Needs  . Financial resource strain: Not hard at all  . Food insecurity:    Worry: Never true    Inability: Never true  . Transportation needs:    Medical: No    Non-medical: No  Tobacco Use  .  Smoking status: Never Smoker  . Smokeless tobacco: Never Used  Substance and Sexual Activity  . Alcohol use: Yes    Alcohol/week: 0.0 standard drinks    Comment: occasional- 1 drink per month  . Drug use: Yes    Types: Marijuana    Comment: last used 3 weeks ago  . Sexual activity: Yes    Partners: Male    Birth control/protection: IUD  Lifestyle  . Physical activity:    Days per week: 0 days    Minutes per session: 0 min  . Stress: To some extent  Relationships  . Social connections:    Talks on phone: Once a week    Gets together: Once a week    Attends religious service: Never    Active member of club or organization: No    Attends meetings of clubs or organizations: Never    Relationship status: Married  Other Topics Concern  . Not on file  Social History Narrative  . Not on file    Allergies: No Known Allergies  Metabolic Disorder Labs: No  results found for: HGBA1C, MPG No results found for: PROLACTIN Lab Results  Component Value Date   CHOL 154 08/29/2017   TRIG 85 08/29/2017   HDL 51 08/29/2017   CHOLHDL 3.0 08/29/2017   LDLCALC 86 08/29/2017   Lab Results  Component Value Date   TSH 0.722 08/29/2017    Therapeutic Level Labs: No results found for: LITHIUM No results found for: VALPROATE No components found for:  CBMZ  Current Medications: Current Outpatient Medications  Medication Sig Dispense Refill  . buPROPion (WELLBUTRIN XL) 300 MG 24 hr tablet Take 1 tablet (300 mg total) by mouth daily. 90 tablet 0  . hydrOXYzine (ATARAX/VISTARIL) 10 MG tablet TAKE 1-2 TABLETS BY MOUTH 3 TIMES DAILY AS NEEDED FOR ANXIETY (ONLY FOR SEVERE ANXIETY). 180 tablet 0  . sertraline (ZOLOFT) 50 MG tablet Take 1.5 tablets (75 mg total) by mouth every evening. Pt has supplies 135 tablet 1   Current Facility-Administered Medications  Medication Dose Route Frequency Provider Last Rate Last Dose  . Levonorgestrel IUD 19.5 mg  1 each Intrauterine Once Schuman, Jaquelyn Bitterhristanna R, MD         Musculoskeletal: Strength & Muscle Tone: within normal limits Gait & Station: normal Patient leans: N/A  Psychiatric Specialty Exam: Review of Systems  Psychiatric/Behavioral: The patient is nervous/anxious.   All other systems reviewed and are negative.   Blood pressure 110/73, pulse 68, temperature 98.1 F (36.7 C), temperature source Oral, weight 182 lb 3.2 oz (82.6 kg).Body mass index is 32.28 kg/m.  General Appearance: Casual  Eye Contact:  Fair  Speech:  Clear and Coherent  Volume:  Normal  Mood:  Anxious  Affect:  Appropriate  Thought Process:  Goal Directed and Descriptions of Associations: Intact  Orientation:  Full (Time, Place, and Person)  Thought Content: Logical   Suicidal Thoughts:  No  Homicidal Thoughts:  No  Memory:  Immediate;   Fair Recent;   Fair Remote;   Fair  Judgement:  Fair  Insight:  Fair  Psychomotor  Activity:  Normal  Concentration:  Concentration: Fair and Attention Span: Fair  Recall:  [employer]  Fund of Knowledge: Fair  Language: Fair  Akathisia:  No  Handed:  Right  AIMS (if indicated): na  Assets:  Communication Skills Desire for Improvement Social Support  ADL's:  Intact  Cognition: WNL  Sleep:  Fair   Screenings: PHQ2-9     Office  Visit from 08/29/2017 in [facility]  PHQ-2 Total Score  6  PHQ-9 Total Score  21       Assessment and Plan: Viana is a 27 year old Caucasian female who has a history of depression, anxiety, insomnia, presented to the clinic today for a follow-up visit.  Patient reports some irritability, unknown if this is due to her Wellbutrin which was increased recently.  We will make the following medication changes as noted below.  Plan MDD Reduce Wellbutrin XL to 300 mg p.o. daily Increase Zoloft to 75 mg p.o. Daily  For GAD Zoloft 75 mg p.o. daily  For panic attacks Zoloft 75 mg p.o. daily Hydroxyzine 10-20 mg p.o. 3 times daily as needed  Insomnia She reports sleep is improved.  She continues to make use of melatonin as needed Epworth sleep scale-9  Discussed with patient to make an appointment with Joni Reining for psychotherapy.  Follow-up in clinic in 6-8 weeks or sooner if needed  More than 50 % of the time was spent for psychoeducation and supportive psychotherapy and care coordination.  This note was generated in part or whole with voice recognition software. Voice recognition is usually quite accurate but there are transcription errors that can and very often do occur. I apologize for any typographical errors that were not detected and corrected.         Jomarie Longs, MD 01/26/2018, 12:42 PM

## 2018-01-26 NOTE — Patient Instructions (Signed)
Start taking wellbutrin 300 mg daily.  Start taking Zoloft 75 mg daily.  Schedule an appointment with Joni ReiningNicole for therapy.

## 2018-01-29 ENCOUNTER — Other Ambulatory Visit: Payer: Self-pay | Admitting: Psychiatry

## 2018-02-13 ENCOUNTER — Ambulatory Visit (INDEPENDENT_AMBULATORY_CARE_PROVIDER_SITE_OTHER): Payer: Commercial Managed Care - PPO | Admitting: Licensed Clinical Social Worker

## 2018-02-13 DIAGNOSIS — F411 Generalized anxiety disorder: Secondary | ICD-10-CM

## 2018-02-13 DIAGNOSIS — F330 Major depressive disorder, recurrent, mild: Secondary | ICD-10-CM
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
## 2018-02-27 ENCOUNTER — Ambulatory Visit (INDEPENDENT_AMBULATORY_CARE_PROVIDER_SITE_OTHER): Payer: Commercial Managed Care - PPO | Admitting: Licensed Clinical Social Worker

## 2018-02-27 DIAGNOSIS — F330 Major depressive disorder, recurrent, mild: Secondary | ICD-10-CM | POA: Diagnosis not present

## 2018-02-27 DIAGNOSIS — F411 Generalized anxiety disorder: Secondary | ICD-10-CM | POA: Diagnosis not present

## 2018-03-23 ENCOUNTER — Other Ambulatory Visit: Payer: Self-pay

## 2018-03-23 ENCOUNTER — Encounter: Payer: Self-pay | Admitting: Psychiatry

## 2018-03-23 ENCOUNTER — Ambulatory Visit (INDEPENDENT_AMBULATORY_CARE_PROVIDER_SITE_OTHER): Payer: Commercial Managed Care - PPO | Admitting: Psychiatry

## 2018-03-23 VITALS — BP 109/72 | HR 77 | Temp 98.0°F | Wt 191.4 lb

## 2018-03-23 DIAGNOSIS — F411 Generalized anxiety disorder: Secondary | ICD-10-CM

## 2018-03-23 DIAGNOSIS — F410 Panic disorder [episodic paroxysmal anxiety] without agoraphobia: Secondary | ICD-10-CM | POA: Diagnosis not present

## 2018-03-23 DIAGNOSIS — F3181 Bipolar II disorder: Secondary | ICD-10-CM

## 2018-03-23 MED ORDER — RISPERIDONE 0.5 MG PO TABS: 0.5000 mg | ORAL_TABLET | Freq: Every day | ORAL | 0 refills | Status: DC

## 2018-03-23 NOTE — Progress Notes (Signed)
   THERAPIST PROGRESS NOTE  Session Time: 29mn  Participation Level: Active  Behavioral Response: CasualAlertEuthymic  Type of Therapy: Individual Therapy  Treatment Goals addressed: Coping  Interventions: Motivational Interviewing  Summary: BViriginia Amendolais a 27y.o. female who presents with continued symptoms of her diagnosis.  Therapist met with Patient in an initial therapy session to assess current mood and to build rapport. Therapist engaged Patient in discussion about her life and what is going well for her. Therapist provided support for Patient as she shared details about her life, her current stressors, mood, coping skills, her past, and her children. Therapist prompted Patient to discuss her support system and ways that she manages her daily stress, anger, and frustrations. LCSW discussed what psychotherapy is and is not and the importance of the therapeutic relationship to include open and honest communication between client and therapist and building trust.  Reviewed advantages and disadvantages of the therapeutic process and limitations to the therapeutic relationship including LCSW's role in maintaining the safety of the client, others and those in client's care.     Suicidal/Homicidal: No  Plan: Return again in 2 weeks.  Diagnosis: Axis I: Generalized Anxiety Disorder    Axis II: No diagnosis    NLubertha South LCSW 02/27/2018

## 2018-03-23 NOTE — Progress Notes (Signed)
BH MD OP Progress Note  03/23/2018 4:06 PM Suzanne Boyd  MRN:  [id number]  Chief Complaint: ' I am here for follow up." Chief Complaint    Follow-up; Medication Refill     HPI: Suzanne Boyd is a 27 year old Caucasian female, employed, married, lives in [place], has a history of depression, anxiety, panic attacks, presented to the clinic today for a follow-up visit.  Patient today reports she continues to feel worse on the current medication regimen.  She reports she is irritable on a regular basis now.  She reports she has noticed some hypomanic episodes.  She reports she may have been struggling with this for a long time however is currently more aware of it.  She describes her symptoms as feeling irritable, much more self-confident than usual some days, more talkative, racing thoughts, being easily distracted, being hypersexual and so on.  She reports she also feels sad and down and has crying spells which can happen the same day.  She reports sleep is improved now.  Patient reports her aunt on her mother's side struggles with bipolar disorder and she has noticed she may have some of the symptoms.    She is currently on Wellbutrin and Zoloft and does not think it is helpful.  Patient denies any suicidality or perceptual disturbances.  Patient denies any other concerns today.  Patient was asked to fill out a mood disorder questionnaire and she scored high on the same.  Discussed adding a mood stabilizer.  She has tried Abilify in the past however had side effects of pain when she took the Abilify.  Discussed adding risperidone and she agreed with plan.  Continue psychotherapy sessions with Ms. Peacock. Visit Diagnosis:    ICD-10-CM   1. Bipolar 2 disorder (HCC) F31.81    mixed , mild  2. GAD (generalized anxiety disorder) F41.1   3. Panic attacks F41.0     Past Psychiatric History: Reviewed past psychiatric history from my progress note on 10/25/2017.  Past trials of Paxil, Lexapro,  Abilify, Wellbutrin  Past Medical History:  Past Medical History:  Diagnosis Date  . Anxiety   . Depression     Past Surgical History:  Procedure Laterality Date  . INTRAUTERINE DEVICE (IUD) INSERTION      Family Psychiatric History: I have reviewed family psychiatric history from my progress note on 10/25/2017  Family History:  Family History  Problem Relation Age of Onset  . Cancer Maternal Grandmother        brain  . Depression Maternal Grandmother   . Cancer Maternal Aunt        breast  . Anxiety disorder Maternal Aunt   . Cancer Maternal Aunt        breast  . Cancer Maternal Aunt        breast  . Cancer Maternal Aunt        breast  . Cancer Maternal Aunt        breast  . Cancer Maternal Aunt        breast  . Cancer Maternal Aunt        breast  . Cancer - Colon Maternal Uncle   . Hyperlipidemia Mother   . Hypertension Mother   . Varicose Veins Mother   . Hypertension Father   . Thyroid disease Father   . Thyroid disease Maternal Grandfather   . Ovarian cancer Neg Hx     Social History: Reviewed social history from my progress note on 10/25/2017 Social History   Socioeconomic  History  . Marital status: Married    Spouse name: [name]  . Number of children: 0  . Years of education: 2  . Highest education level: High school graduate  Occupational History  . Occupation: Air cabin crew    Comment: full time  Social Needs  . Financial resource strain: Not hard at all  . Food insecurity:    Worry: Never true    Inability: Never true  . Transportation needs:    Medical: No    Non-medical: No  Tobacco Use  . Smoking status: Never Smoker  . Smokeless tobacco: Never Used  Substance and Sexual Activity  . Alcohol use: Yes    Alcohol/week: 0.0 standard drinks    Comment: occasional- 1 drink per month  . Drug use: Yes    Types: Marijuana    Comment: last used 3 weeks ago  . Sexual activity: Yes    Partners: Male    Birth control/protection: IUD   Lifestyle  . Physical activity:    Days per week: 0 days    Minutes per session: 0 min  . Stress: To some extent  Relationships  . Social connections:    Talks on phone: Once a week    Gets together: Once a week    Attends religious service: Never    Active member of club or organization: No    Attends meetings of clubs or organizations: Never    Relationship status: Married  Other Topics Concern  . Not on file  Social History Narrative  . Not on file    Allergies: No Known Allergies  Metabolic Disorder Labs: No results found for: HGBA1C, MPG No results found for: PROLACTIN Lab Results  Component Value Date   CHOL 154 08/29/2017   TRIG 85 08/29/2017   HDL 51 08/29/2017   CHOLHDL 3.0 08/29/2017   LDLCALC 86 08/29/2017   Lab Results  Component Value Date   TSH 0.722 08/29/2017    Therapeutic Level Labs: No results found for: LITHIUM No results found for: VALPROATE No components found for:  CBMZ  Current Medications: Current Outpatient Medications  Medication Sig Dispense Refill  . buPROPion (WELLBUTRIN XL) 300 MG 24 hr tablet Take 1 tablet (300 mg total) by mouth daily. 90 tablet 0  . hydrOXYzine (ATARAX/VISTARIL) 10 MG tablet TAKE 1-2 TABLETS BY MOUTH 3 TIMES DAILY AS NEEDED FOR ANXIETY (ONLY FOR SEVERE ANXIETY). 180 tablet 0  . sertraline (ZOLOFT) 50 MG tablet Take 1.5 tablets (75 mg total) by mouth every evening. Pt has supplies 135 tablet 1  . risperiDONE (RISPERDAL) 0.5 MG tablet Take 1 tablet (0.5 mg total) by mouth at bedtime. For mood 15 tablet 0   Current Facility-Administered Medications  Medication Dose Route Frequency Provider Last Rate Last Dose  . Levonorgestrel IUD 19.5 mg  1 each Intrauterine Once Schuman, Jaquelyn Bitter, MD         Musculoskeletal: Strength & Muscle Tone: within normal limits Gait & Station: normal Patient leans: N/A  Psychiatric Specialty Exam: Review of Systems  Psychiatric/Behavioral: Positive for depression. The  patient is nervous/anxious.   All other systems reviewed and are negative.   Blood pressure 109/72, pulse 77, temperature 98 F (36.7 C), temperature source Oral, weight 191 lb 6.4 oz (86.8 kg).Body mass index is 33.9 kg/m.  General Appearance: Casual  Eye Contact:  Fair  Speech:  Normal Rate  Volume:  Normal  Mood:  Anxious  Affect:  Congruent  Thought Process:  Goal Directed and Descriptions of  Associations: Intact  Orientation:  Full (Time, Place, and Person)  Thought Content: Logical   Suicidal Thoughts:  No  Homicidal Thoughts:  No  Memory:  Immediate;   Fair Recent;   Fair Remote;   Fair  Judgement:  Fair  Insight:  Fair  Psychomotor Activity:  Normal  Concentration:  Concentration: Fair and Attention Span: Fair  Recall:  [employer] of Knowledge: Fair  Language: Fair  Akathisia:  No  Handed:  Right  AIMS (if indicated): na  Assets:  Communication Skills Desire for Improvement Social Support  ADL's:  Intact  Cognition: WNL  Sleep:  improved   Screenings: PHQ2-9     Office Visit from 08/29/2017 in [facility]  PHQ-2 Total Score  6  PHQ-9 Total Score  21       Assessment and Plan: Camri is a 27 yr old Caucasian female, who has a hx of depression, anxiety, insomnia, presented to the clinic today for a follow-up visit.  Patient today reports she has noticed that she may have some hypomanic episodes.  She reports she may have had it since a long time however she has been more aware of it recently.  Patient completed a mood disorder questionnaire and scored high on the same.  It is likely that patient may have bipolar type II mixed episodes.  Discussed adding a mood stabilizer.  Will continue plan as noted below.  Plan Bipolar type II Continue Wellbutrin and Zoloft as prescribed. Add risperidone 0.5 mg p.o. nightly Provided medication education. Mood disorder questionnaire- scored high.  #1-13, question #2-yes, question #3-moderate problem, question  #4-yes.  For GAD Zoloft as prescribed  For panic attacks Zoloft 75 mg p.o. daily Hydroxyzine 10-20 mg p.o. 3 times daily as needed  Insomnia Melatonin as prescribed Epworth sleep scale-9  Patient to continue psychotherapy visits with Ms. Peacock.  Follow-up in clinic in 2 weeks or sooner if needed.  More than 50 % of the time was spent for psychoeducation and supportive psychotherapy and care coordination.  This note was generated in part or whole with voice recognition software. Voice recognition is usually quite accurate but there are transcription errors that can and very often do occur. I apologize for any typographical errors that were not detected and corrected.         Jomarie Longs, MD 03/23/2018, 4:06 PM

## 2018-03-23 NOTE — Patient Instructions (Signed)
Risperidone tablets What is this medicine? RISPERIDONE (ris PER i done) is an antipsychotic. It is used to treat schizophrenia, bipolar disorder, and some symptoms of autism. This medicine may be used for other purposes; ask your health care provider or pharmacist if you have questions. COMMON BRAND NAME(S): Risperdal What should I tell my health care provider before I take this medicine? They need to know if you have any of these conditions: -blood disorder or disease -dementia -diabetes or a family history of diabetes -difficulty swallowing -heart disease or previous heart attack -history of brain tumor or head injury -history of breast cancer -irregular heartbeat or low blood pressure -kidney or liver disease -Parkinson's disease -seizures (convulsions) -an unusual or allergic reaction to risperidone, paliperidone, other medicines, foods, dyes, or preservatives -pregnant or trying to get pregnant -breast-feeding How should I use this medicine? Take this medicine by mouth with a glass of water. Follow the directions on the prescription label. You can take it with or without food. If it upsets your stomach, take it with food. Take your medicine at regular intervals. Do not take it more often than directed. Do not stop taking except on your doctor's advice. Talk to your pediatrician regarding the use of this medicine in children. While this drug may be prescribed for children as young as 5 years of age for selected conditions, precautions do apply. Overdosage: If you think you have taken too much of this medicine contact a poison control center or emergency room at once. NOTE: This medicine is only for you. Do not share this medicine with others. What if I miss a dose? If you miss a dose, take it as soon as you can. If it is almost time for your next dose, take only that dose. Do not take double or extra doses. What may interact with this medicine? Do not take this medicine with any of  the following medications: -certain medicines for fungal infections like fluconazole, itraconazole, ketoconazole, posaconazole, voriconazole -cisapride -dofetilide -dronedarone -droperidol -pimozide -sparfloxacin -thioridazine This medicine may also interact with the following medications: -arsenic trioxide -certain antibiotics like clarithromycin, gatifloxacin, levofloxacin, moxifloxacin, pentamidine, rifampin -certain medicines for blood pressure -certain medicines for cancer -certain medicines for irregular heart beat -certain medications for Parkinson's disease like levodopa -certain medicines for seizures like carbamazepine -certain medicines for sleep or sedation -narcotic medicines for pain -other medicines for mental anxiety, depression, or psychotic disturbances -other medicines that prolong the QT interval (cause an abnormal heart rhythm) -ritonavir This list may not describe all possible interactions. Give your health care provider a list of all the medicines, herbs, non-prescription drugs, or dietary supplements you use. Also tell them if you smoke, drink alcohol, or use illegal drugs. Some items may interact with your medicine. What should I watch for while using this medicine? Visit your doctor or health care professional for regular checks on your progress. It may be several weeks before you see the full effects. Do not suddenly stop taking this medicine. You may need to gradually reduce the dose. Only stop taking this medicine on the advice of your doctor or health care professional. You may get dizzy or drowsy. Do not drive, use machinery, or do anything that needs mental alertness until you know how this medicine affects you. Do not stand or sit up quickly, especially if you are an older patient. This reduces the risk of dizzy or fainting spells. Alcohol can increase dizziness and drowsiness. Avoid alcoholic drinks. You can get a hangover effect the   morning after a bedtime  dose. Do not treat yourself for colds, diarrhea or allergies. Ask your doctor or health care professional for advice, some nonprescription medicines may increase possible side effects. This medicine can reduce the response of your body to heat or cold. Dress warm in cold weather and stay hydrated in hot weather. If possible, avoid extreme temperatures like saunas, hot tubs, very hot or cold showers, or activities that can cause dehydration such as vigorous exercise. What side effects may I notice from receiving this medicine? Side effects that you should report to your doctor or health care professional as soon as possible: -aching muscles and joints -confusion -fainting spells -fast or irregular heartbeat -fever or chills, sore throat -increased hunger or thirst -increased urination -loss of balance, difficulty walking or falls -stiffness, spasms, trembling -uncontrollable head, mouth, neck, arm, or leg movements -unusually weak or tired Side effects that usually do not require medical attention (report to your doctor or health care professional if they continue or are bothersome): -constipation -decreased sexual ability -difficulty sleeping -drowsiness or dizziness -increase or decrease in saliva -nausea, vomiting -weight gain This list may not describe all possible side effects. Call your doctor for medical advice about side effects. You may report side effects to FDA at 1-800-FDA-1088. Where should I keep my medicine? Keep out of the reach of children. Store at room temperature between 15 and 25 degrees C (59 and 77 degrees F). Protect from light. Throw away any unused medicine after the expiration date. NOTE: This sheet is a summary. It may not cover all possible information. If you have questions about this medicine, talk to your doctor, pharmacist, or health care provider.  2018 Elsevier/Gold Standard (2015-12-22 18:50:25)  

## 2018-04-03 ENCOUNTER — Ambulatory Visit: Payer: Commercial Managed Care - PPO | Admitting: Licensed Clinical Social Worker

## 2018-04-03 DIAGNOSIS — F3181 Bipolar II disorder: Secondary | ICD-10-CM

## 2018-04-04 ENCOUNTER — Ambulatory Visit (INDEPENDENT_AMBULATORY_CARE_PROVIDER_SITE_OTHER): Payer: Commercial Managed Care - PPO | Admitting: Psychiatry

## 2018-04-04 ENCOUNTER — Encounter: Payer: Self-pay | Admitting: Psychiatry

## 2018-04-04 ENCOUNTER — Other Ambulatory Visit: Payer: Self-pay

## 2018-04-04 VITALS — BP 112/74 | HR 76 | Temp 97.9°F | Wt 193.6 lb

## 2018-04-04 DIAGNOSIS — F3181 Bipolar II disorder: Secondary | ICD-10-CM | POA: Diagnosis not present

## 2018-04-04 DIAGNOSIS — F411 Generalized anxiety disorder: Secondary | ICD-10-CM

## 2018-04-04 DIAGNOSIS — F410 Panic disorder [episodic paroxysmal anxiety] without agoraphobia: Secondary | ICD-10-CM

## 2018-04-04 MED ORDER — BUPROPION HCL ER (XL) 300 MG PO TB24: 300.0000 mg | ORAL_TABLET | Freq: Every day | ORAL | 0 refills | Status: DC

## 2018-04-04 MED ORDER — RISPERIDONE 1 MG PO TABS: 1.0000 mg | ORAL_TABLET | Freq: Every day | ORAL | 0 refills | Status: DC

## 2018-04-04 NOTE — Progress Notes (Signed)
BH MD OP Progress Note  04/04/2018 4:53 PM Suzanne Boyd  MRN:  [id number]  Chief Complaint: ' I am here for follow up." Chief Complaint    Follow-up; Medication Refill     HPI: Suzanne Boyd is a 27 yr old Caucasian female, employed, married, lives in [place], has a history of bipolar disorder, anxiety, panic attacks, presented to the clinic today for a follow-up visit.  Patient today reports she has noticed some improvement in her mood symptoms.  She reports she feels more energetic and motivated than before.  She denies any significant depressive symptoms.  She however continues to struggle with anxiety symptoms.  She reports she has noticed a lot of anxiety symptoms in the morning when she is about to go to work as well as when she is in public places.  She continues to be compliant with her Wellbutrin, Zoloft.  She also takes hydroxyzine as needed for panic attacks.  She reports she has not had a full-blown panic attack since her last visit here.  She is compliant with her risperidone.  She denies any side effects.  Discussed readjusting her risperidone dose today.  She agrees with plan.  She denies any suicidality, perceptual disturbances.  Patient will continue psychotherapy sessions which has been very beneficial. Visit Diagnosis:    ICD-10-CM   1. Bipolar 2 disorder (HCC) F31.81 risperiDONE (RISPERDAL) 1 MG tablet    buPROPion (WELLBUTRIN XL) 300 MG 24 hr tablet  2. GAD (generalized anxiety disorder) F41.1   3. Panic attacks F41.0     Past Psychiatric History: I have reviewed past psychiatric history from my progress note on 10/25/2017.  Past trials of Paxil, Lexapro, Abilify, Wellbutrin  Past Medical History:  Past Medical History:  Diagnosis Date  . Anxiety   . Depression     Past Surgical History:  Procedure Laterality Date  . INTRAUTERINE DEVICE (IUD) INSERTION      Family Psychiatric History: Have reviewed family psychiatric history from my progress note on 10/25/2017.    Family History:  Family History  Problem Relation Age of Onset  . Cancer Maternal Grandmother        brain  . Depression Maternal Grandmother   . Cancer Maternal Aunt        breast  . Anxiety disorder Maternal Aunt   . Cancer Maternal Aunt        breast  . Cancer Maternal Aunt        breast  . Cancer Maternal Aunt        breast  . Cancer Maternal Aunt        breast  . Cancer Maternal Aunt        breast  . Cancer Maternal Aunt        breast  . Cancer - Colon Maternal Uncle   . Hyperlipidemia Mother   . Hypertension Mother   . Varicose Veins Mother   . Hypertension Father   . Thyroid disease Father   . Thyroid disease Maternal Grandfather   . Ovarian cancer Neg Hx     Social History: Reviewed social history from my progress note on 10/25/2017. Social History   Socioeconomic History  . Marital status: Married    Spouse name: [name]  . Number of children: 0  . Years of education: 39  . Highest education level: High school graduate  Occupational History  . Occupation: Air cabin crew    Comment: full time  Social Needs  . Financial resource strain: Not hard at all  .  Food insecurity:    Worry: Never true    Inability: Never true  . Transportation needs:    Medical: No    Non-medical: No  Tobacco Use  . Smoking status: Never Smoker  . Smokeless tobacco: Never Used  Substance and Sexual Activity  . Alcohol use: Yes    Alcohol/week: 0.0 standard drinks    Comment: occasional- 1 drink per month  . Drug use: Yes    Types: Marijuana    Comment: last used 3 weeks ago  . Sexual activity: Yes    Partners: Male    Birth control/protection: IUD  Lifestyle  . Physical activity:    Days per week: 0 days    Minutes per session: 0 min  . Stress: To some extent  Relationships  . Social connections:    Talks on phone: Once a week    Gets together: Once a week    Attends religious service: Never    Active member of club or organization: No    Attends meetings of  clubs or organizations: Never    Relationship status: Married  Other Topics Concern  . Not on file  Social History Narrative  . Not on file    Allergies: No Known Allergies  Metabolic Disorder Labs: No results found for: HGBA1C, MPG No results found for: PROLACTIN Lab Results  Component Value Date   CHOL 154 08/29/2017   TRIG 85 08/29/2017   HDL 51 08/29/2017   CHOLHDL 3.0 08/29/2017   LDLCALC 86 08/29/2017   Lab Results  Component Value Date   TSH 0.722 08/29/2017    Therapeutic Level Labs: No results found for: LITHIUM No results found for: VALPROATE No components found for:  CBMZ  Current Medications: Current Outpatient Medications  Medication Sig Dispense Refill  . buPROPion (WELLBUTRIN XL) 300 MG 24 hr tablet Take 1 tablet (300 mg total) by mouth daily. 90 tablet 0  . hydrOXYzine (ATARAX/VISTARIL) 10 MG tablet TAKE 1-2 TABLETS BY MOUTH 3 TIMES DAILY AS NEEDED FOR ANXIETY (ONLY FOR SEVERE ANXIETY). 180 tablet 0  . sertraline (ZOLOFT) 50 MG tablet Take 1.5 tablets (75 mg total) by mouth every evening. Pt has supplies 135 tablet 1  . risperiDONE (RISPERDAL) 1 MG tablet Take 1 tablet (1 mg total) by mouth at bedtime. 90 tablet 0   Current Facility-Administered Medications  Medication Dose Route Frequency Provider Last Rate Last Dose  . Levonorgestrel IUD 19.5 mg  1 each Intrauterine Once Schuman, Jaquelyn Bitterhristanna R, MD         Musculoskeletal: Strength & Muscle Tone: within normal limits Gait & Station: normal Patient leans: N/A  Psychiatric Specialty Exam: Review of Systems  Psychiatric/Behavioral: Positive for depression (improving). The patient is nervous/anxious.   All other systems reviewed and are negative.   Blood pressure 112/74, pulse 76, temperature 97.9 F (36.6 C), temperature source Oral, weight 193 lb 9.6 oz (87.8 kg).Body mass index is 34.29 kg/m.  General Appearance: Casual  Eye Contact:  Fair  Speech:  Clear and Coherent  Volume:  Normal   Mood:  Anxious  Affect:  Congruent  Thought Process:  Goal Directed and Descriptions of Associations: Intact  Orientation:  Full (Time, Place, and Person)  Thought Content: Logical   Suicidal Thoughts:  No  Homicidal Thoughts:  No  Memory:  Immediate;   Fair Recent;   Fair Remote;   Fair  Judgement:  Fair  Insight:  Fair  Psychomotor Activity:  Normal  Concentration:  Concentration: Fair and Attention  Span: Fair  Recall:  [employer] of Knowledge: Fair  Language: Fair  Akathisia:  No  Handed:  Right  AIMS (if indicated): 0  Assets:  Communication Skills Desire for Improvement Housing Intimacy Social Support Talents/Skills Transportation  ADL's:  Intact  Cognition: WNL  Sleep:  Fair   Screenings: PHQ2-9     Office Visit from 08/29/2017 in [facility]  PHQ-2 Total Score  6  PHQ-9 Total Score  21       Assessment and Plan: Suzanne Boyd is a 27 yr old Caucasian female who has a history of bipolar type II, anxiety, insomnia, presented to the clinic today for a follow-up visit.  Patient is making progress on the current medication regimen however continues to struggle with anxiety symptoms.  We will continue medication changes as noted below.  Plan Bipolar type II Continue Wellbutrin and Zoloft as prescribed Increase risperidone to 1 mg p.o. nightly   For GAD Zoloft as prescribed  For panic attacks Zoloft and hydroxyzine as prescribed. Hydroxyzine 10-20 mg p.o. 3 times daily as needed.  Insomnia Melatonin p.o. as needed.  Patient will continue psychotherapy with Ms. Peacock.  Follow-up in clinic in 6 weeks or sooner if needed.  More than 50 % of the time was spent for psychoeducation and supportive psychotherapy and care coordination.  This note was generated in part or whole with voice recognition software. Voice recognition is usually quite accurate but there are transcription errors that can and very often do occur. I apologize for any  typographical errors that were not detected and corrected.         Jomarie Longs, MD 04/04/2018, 4:53 PM

## 2018-05-03 NOTE — Progress Notes (Signed)
   THERAPIST PROGRESS NOTE  Session Time: 60min  Participation Level: Active  Behavioral Response: CasualAlertEuthymic  Type of Therapy: Individual Therapy  Treatment Goals addressed: Coping  Interventions: Motivational Interviewing  Summary: [name] Clinger is a 27 y.o. female who presents with a regression in symptoms. Patient reports "good" mood.  Discussion of relationship expectations and how to manage a serious relationship along with "life stuff."  Therapist educated Patient on expectations, communication and roles.    Suicidal/Homicidal: No   Plan: Return again in 2 weeks.  Diagnosis: Axis I: Bipolar, Depressed    Axis II: No diagnosis    Marinda Elkicole M Peacock, LCSW 04/03/2018

## 2018-05-15 ENCOUNTER — Ambulatory Visit: Payer: Commercial Managed Care - PPO | Admitting: Licensed Clinical Social Worker

## 2018-05-16 ENCOUNTER — Ambulatory Visit (INDEPENDENT_AMBULATORY_CARE_PROVIDER_SITE_OTHER): Payer: Commercial Managed Care - PPO | Admitting: Psychiatry

## 2018-05-16 ENCOUNTER — Encounter: Payer: Self-pay | Admitting: Psychiatry

## 2018-05-16 VITALS — BP 111/73 | HR 78 | Ht 63.0 in | Wt 199.0 lb

## 2018-05-16 DIAGNOSIS — F410 Panic disorder [episodic paroxysmal anxiety] without agoraphobia: Secondary | ICD-10-CM | POA: Diagnosis not present

## 2018-05-16 DIAGNOSIS — F3181 Bipolar II disorder: Secondary | ICD-10-CM | POA: Diagnosis not present

## 2018-05-16 DIAGNOSIS — F411 Generalized anxiety disorder: Secondary | ICD-10-CM

## 2018-05-16 MED ORDER — RISPERIDONE 1 MG PO TABS: 1.5000 mg | ORAL_TABLET | Freq: Every day | ORAL | 0 refills | Status: DC

## 2018-05-16 NOTE — Progress Notes (Signed)
BH MD OP Progress Note  05/16/2018 2:52 PM Suzanne LennertBrandi Boyd  MRN:  [id number]  Chief Complaint: ' I am here for follow up.' Chief Complaint    Follow-up     HPI: Suzanne Boyd is a 27 year old Caucasian female, employed, married, lives in [place], has a history of bipolar disorder, anxiety, panic attacks, presented to the clinic today for a follow-up visit.  Patient today reports she is currently making progress on the current medication regimen however continues to struggle with some mood lability on and off.  Reports she is tolerating the risperidone well.  She denies any side effects of tremors, muscle rigidity or stiffness.  She reports sleep as well.  She denies any perceptual disturbances.  She denies any suicidality or homicidality.  She is currently on vacation and has to go back to work this coming Thursday.  She reports her Christmas holiday went well.  She continues to have good support system from her boyfriend. Visit Diagnosis:    ICD-10-CM   1. Bipolar 2 disorder (HCC) F31.81 risperiDONE (RISPERDAL) 1 MG tablet  2. GAD (generalized anxiety disorder) F41.1   3. Panic attacks F41.0     Past Psychiatric History: Reviewed past psychiatric history from my progress note on 10/25/2017.  Past trials of Paxil, Lexapro, Abilify, Wellbutrin.  Past Medical History:  Past Medical History:  Diagnosis Date  . Anxiety   . Depression     Past Surgical History:  Procedure Laterality Date  . INTRAUTERINE DEVICE (IUD) INSERTION      Family Psychiatric History: I have reviewed family psychiatric history from my progress note on 10/25/2017  Family History:  Family History  Problem Relation Age of Onset  . Cancer Maternal Grandmother        brain  . Depression Maternal Grandmother   . Cancer Maternal Aunt        breast  . Anxiety disorder Maternal Aunt   . Cancer Maternal Aunt        breast  . Cancer Maternal Aunt        breast  . Cancer Maternal Aunt        breast  . Cancer  Maternal Aunt        breast  . Cancer Maternal Aunt        breast  . Cancer Maternal Aunt        breast  . Cancer - Colon Maternal Uncle   . Hyperlipidemia Mother   . Hypertension Mother   . Varicose Veins Mother   . Hypertension Father   . Thyroid disease Father   . Thyroid disease Maternal Grandfather   . Ovarian cancer Neg Hx     Social History: Reviewed social history from my progress note on 10/25/2017. Social History   Socioeconomic History  . Marital status: Married    Spouse name: Weston Brassick  . Number of children: 0  . Years of education: 7912  . Highest education level: High school graduate  Occupational History  . 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 Financial resource strain: Not hard at all  . Food insecurity:    Worry: Never true    Inability: Never true  . Transportation needs:    Medical: No    Non-medical: No  Tobacco Use  . Smoking status: Never Smoker  . Smokeless tobacco: Never Used  Substance and Sexual Activity  . Alcohol use: Yes    Alcohol/week: 0.0 standard drinks    Comment: occasional- 1  drink per month  . Drug use: Yes    Types: Marijuana    Comment: last used 3 weeks ago  . Sexual activity: Yes    Partners: Male    Birth control/protection: I.U.D.  Lifestyle  . Physical activity:    Days per week: 0 days    Minutes per session: 0 min  . Stress: To some extent  Relationships  . Social connections:    Talks on phone: Once a week    Gets together: Once a week    Attends religious service: Never    Active member of club or organization: No    Attends meetings of clubs or organizations: Never    Relationship status: Married  Other Topics Concern  . Not on file  Social History Narrative  . Not on file    Allergies: No Known Allergies  Metabolic Disorder Labs: No results found for: HGBA1C, MPG No results found for: PROLACTIN Lab Results  Component Value Date   CHOL 154 08/29/2017   TRIG 85 08/29/2017    HDL 51 08/29/2017   CHOLHDL 3.0 08/29/2017   LDLCALC 86 08/29/2017   Lab Results  Component Value Date   TSH 0.722 08/29/2017    Therapeutic Level Labs: No results found for: LITHIUM No results found for: VALPROATE No components found for:  CBMZ  Current Medications: Current Outpatient Medications  Medication Sig Dispense Refill  . buPROPion (WELLBUTRIN XL) 300 MG 24 hr tablet Take 1 tablet (300 mg total) by mouth daily. 90 tablet 0  . hydrOXYzine (ATARAX/VISTARIL) 10 MG tablet TAKE 1-2 TABLETS BY MOUTH 3 TIMES DAILY AS NEEDED FOR ANXIETY (ONLY FOR SEVERE ANXIETY). 180 tablet 0  . risperiDONE (RISPERDAL) 1 MG tablet Take 1.5 tablets (1.5 mg total) by mouth at bedtime. 135 tablet 0  . sertraline (ZOLOFT) 50 MG tablet Take 1.5 tablets (75 mg total) by mouth every evening. Pt has supplies 135 tablet 1   Current Facility-Administered Medications  Medication Dose Route Frequency Provider Last Rate Last Dose  . Levonorgestrel IUD 19.5 mg  1 each Intrauterine Once Schuman, Jaquelyn Bitter, MD         Musculoskeletal: Strength & Muscle Tone: within normal limits Gait & Station: normal Patient leans: N/A  Psychiatric Specialty Exam: Review of Systems  Psychiatric/Behavioral: The patient is nervous/anxious.   All other systems reviewed and are negative.   Blood pressure 111/73, pulse 78, height 5\' 3"  (1.6 m), weight 199 lb (90.3 kg), SpO2 98 %.Body mass index is 35.25 kg/m.  General Appearance: Casual  Eye Contact:  Fair  Speech:  Clear and Coherent  Volume:  Normal  Mood:  Anxious  Affect:  Congruent  Thought Process:  Goal Directed and Descriptions of Associations: Intact  Orientation:  Full (Time, Place, and Person)  Thought Content: Logical   Suicidal Thoughts:  No  Homicidal Thoughts:  No  Memory:  Immediate;   Fair Recent;   Fair Remote;   Fair  Judgement:  Fair  Insight:  Fair  Psychomotor Activity:  Normal  Concentration:  Concentration: Fair and Attention Span:  Fair  Recall:  [employer] of Knowledge: Fair  Language: Fair  Akathisia:  No  Handed:  Right  AIMS (if indicated):0  Assets:  Communication Skills Desire for Improvement Social Support  ADL's:  Intact  Cognition: WNL  Sleep:  Fair   Screenings: PHQ2-9     Office Visit from 08/29/2017 in [facility]  PHQ-2 Total Score  6  PHQ-9 Total  Score  21       Assessment and Plan: Suzanne Boyd is a 27 year old Caucasian female who has a history of bipolar disorder type II, anxiety, insomnia, presented to the clinic today for a follow-up visit.  Patient continues to struggle with some mood lability even though making progress.  We will continue to make medication readjustment.  Plan Bipolar disorder type II Continue Wellbutrin and Zoloft as prescribed Increase risperidone to 1.5 mg p.o. nightly  GAD Zoloft as prescribed  For panic attacks Zoloft and hydroxyzine as prescribed Hydroxyzine 10 to 20 mg p.o. 3 times daily as needed  Insomnia Melatonin p.o. as needed  Patient will continue psychotherapy with Ms. Peacock.  Follow-up in clinic in 5 weeks or sooner if needed.  More than 50 % of the time was spent for psychoeducation and supportive psychotherapy and care coordination.  This note was generated in part or whole with voice recognition software. Voice recognition is usually quite accurate but there are transcription errors that can and very often do occur. I apologize for any typographical errors that were not detected and corrected.       Jomarie LongsSaramma Zerah Hilyer, MD 05/16/2018, 2:52 PM

## 2018-06-05 ENCOUNTER — Telehealth: Payer: Self-pay

## 2018-06-05 DIAGNOSIS — F3181 Bipolar II disorder: Secondary | ICD-10-CM

## 2018-06-05 NOTE — Telephone Encounter (Signed)
pt called left message that her insurance will not cover [facility] anymore she has to uses [facility].  please send pt medication to [facility].

## 2018-06-06 MED ORDER — RISPERIDONE 1 MG PO TABS: 1.5000 mg | ORAL_TABLET | Freq: Every day | ORAL | 0 refills | Status: DC

## 2018-06-06 MED ORDER — HYDROXYZINE HCL 10 MG PO TABS: ORAL_TABLET | ORAL | 0 refills | Status: DC

## 2018-06-06 MED ORDER — SERTRALINE HCL 50 MG PO TABS: 75.0000 mg | ORAL_TABLET | Freq: Every evening | ORAL | 1 refills | Status: DC

## 2018-06-06 MED ORDER — BUPROPION HCL ER (XL) 300 MG PO TB24: 300.0000 mg | ORAL_TABLET | Freq: Every day | ORAL | 0 refills | Status: DC

## 2018-06-06 NOTE — Telephone Encounter (Signed)
Sent refills to [facility]

## 2018-06-08 ENCOUNTER — Ambulatory Visit: Payer: 59 | Admitting: Licensed Clinical Social Worker

## 2018-06-08 DIAGNOSIS — F411 Generalized anxiety disorder: Secondary | ICD-10-CM

## 2018-06-08 DIAGNOSIS — F3181 Bipolar II disorder: Secondary | ICD-10-CM

## 2018-06-20 ENCOUNTER — Encounter: Payer: Self-pay | Admitting: Psychiatry

## 2018-06-20 ENCOUNTER — Ambulatory Visit (HOSPITAL_BASED_OUTPATIENT_CLINIC_OR_DEPARTMENT_OTHER): Payer: 59 | Admitting: Psychiatry

## 2018-06-20 ENCOUNTER — Other Ambulatory Visit: Payer: Self-pay

## 2018-06-20 VITALS — BP 111/70 | HR 80 | Temp 97.8°F | Wt 201.6 lb

## 2018-06-20 DIAGNOSIS — F411 Generalized anxiety disorder: Secondary | ICD-10-CM | POA: Diagnosis not present

## 2018-06-20 DIAGNOSIS — F3181 Bipolar II disorder: Secondary | ICD-10-CM | POA: Diagnosis not present

## 2018-06-20 DIAGNOSIS — F410 Panic disorder [episodic paroxysmal anxiety] without agoraphobia: Secondary | ICD-10-CM | POA: Diagnosis not present

## 2018-06-20 MED ORDER — SERTRALINE HCL 50 MG PO TABS: 50.0000 mg | ORAL_TABLET | Freq: Every evening | ORAL | 1 refills | Status: DC

## 2018-06-20 NOTE — Progress Notes (Signed)
BH MD Progress Note  06/20/2018 3:52 PM [name]  MRN:  [id number]  Chief Complaint: ' I am here for follow up.' Chief Complaint    Follow-up; Medication Refill     HPI: Suzanne Boyd is a 28 yr old Caucasian female, employed, married, lives in [place], has a history of bipolar disorder, anxiety attacks, presented to the clinic today for a follow-up visit.  Patient today reports that she has been compliant with her risperidone, Wellbutrin and Zoloft.  Patient however reports she has been feeling lethargic and tired during the day and wonders if it is due to medications.  She is interested in reducing the dosage of some of her medications.  Patient denies any significant panic attacks or anxiety symptoms at this time.  Patient reports sleep is good.  Patient reports work is going well.  Patient denies any suicidality or homicidality.     Visit Diagnosis:    ICD-10-CM   1. Bipolar 2 disorder (HCC) F31.81 sertraline (ZOLOFT) 50 MG tablet  2. GAD (generalized anxiety disorder) F41.1   3. Panic attacks F41.0     Past Psychiatric History: Reviewed past psychiatric history from my progress note on 10/25/2017.  Past trials of Paxil, Lexapro, Abilify, Wellbutrin.       Past Medical History:  Past Medical History:  Diagnosis Date  . Anxiety   . Depression     Past Surgical History:  Procedure Laterality Date  . INTRAUTERINE DEVICE (IUD) INSERTION      Family Psychiatric History: I have reviewed the psychiatric history from my progress note on 10/25/2017.  Family History:  Family History  Problem Relation Age of Onset  . Cancer Maternal Grandmother        brain  . Depression Maternal Grandmother   . Cancer Maternal Aunt        breast  . Anxiety disorder Maternal Aunt   . Cancer Maternal Aunt        breast  . Cancer Maternal Aunt        breast  . Cancer Maternal Aunt        breast  . Cancer Maternal Aunt        breast  . Cancer Maternal Aunt        breast  . Cancer  Maternal Aunt        breast  . Cancer - Colon Maternal Uncle   . Hyperlipidemia Mother   . Hypertension Mother   . Varicose Veins Mother   . Hypertension Father   . Thyroid disease Father   . Thyroid disease Maternal Grandfather   . Ovarian cancer Neg Hx     Social History: Reviewed social history from my progress note on 10/25/2017. Social History   Socioeconomic History  . Marital status: Legally Separated    Spouse name: [name]  . Number of children: 0  . Years of education: 46  . Highest education level: High school graduate  Occupational History  . Occupation: Air cabin crew    Comment: full time  Social Needs  . Financial resource strain: Not hard at all  . Food insecurity:    Worry: Never true    Inability: Never true  . Transportation needs:    Medical: No    Non-medical: No  Tobacco Use  . Smoking status: Never Smoker  . Smokeless tobacco: Never Used  Substance and Sexual Activity  . Alcohol use: Yes    Alcohol/week: 0.0 standard drinks    Comment: occasional- 1 drink per month  .  Drug use: Yes    Types: Marijuana    Comment: last used 3 weeks ago  . Sexual activity: Yes    Partners: Male    Birth control/protection: I.U.D.  Lifestyle  . Physical activity:    Days per week: 0 days    Minutes per session: 0 min  . Stress: To some extent  Relationships  . Social connections:    Talks on phone: Once a week    Gets together: Once a week    Attends religious service: Never    Active member of club or organization: No    Attends meetings of clubs or organizations: Never    Relationship status: Married  Other Topics Concern  . Not on file  Social History Narrative  . Not on file    Allergies: No Known Allergies  Metabolic Disorder Labs: No results found for: HGBA1C, MPG No results found for: PROLACTIN Lab Results  Component Value Date   CHOL 154 08/29/2017   TRIG 85 08/29/2017   HDL 51 08/29/2017   CHOLHDL 3.0 08/29/2017   LDLCALC 86  08/29/2017   Lab Results  Component Value Date   TSH 0.722 08/29/2017    Therapeutic Level Labs: No results found for: LITHIUM No results found for: VALPROATE No components found for:  CBMZ  Current Medications: Current Outpatient Medications  Medication Sig Dispense Refill  . buPROPion (WELLBUTRIN XL) 300 MG 24 hr tablet Take 1 tablet (300 mg total) by mouth daily. 90 tablet 0  . hydrOXYzine (ATARAX/VISTARIL) 10 MG tablet TAKE 1-2 TABLETS BY MOUTH 3 TIMES DAILY AS NEEDED FOR ANXIETY (ONLY FOR SEVERE ANXIETY). 180 tablet 0  . risperiDONE (RISPERDAL) 1 MG tablet Take 1.5 tablets (1.5 mg total) by mouth at bedtime. 135 tablet 0  . sertraline (ZOLOFT) 50 MG tablet Take 1 tablet (50 mg total) by mouth every evening. Pt has supplies 90 tablet 1   Current Facility-Administered Medications  Medication Dose Route Frequency Provider Last Rate Last Dose  . Levonorgestrel IUD 19.5 mg  1 each Intrauterine Once Schuman, Jaquelyn Bitterhristanna R, MD         Musculoskeletal: Strength & Muscle Tone: within normal limits Gait & Station: normal Patient leans: N/A  Psychiatric Specialty Exam: Review of Systems  Constitutional: Positive for malaise/fatigue.  Psychiatric/Behavioral: The patient is not nervous/anxious.   All other systems reviewed and are negative.   Blood pressure 111/70, pulse 80, temperature 97.8 F (36.6 C), temperature source Oral, weight 201 lb 9.6 oz (91.4 kg).Body mass index is 35.71 kg/m.  General Appearance: Casual  Eye Contact:  Fair  Speech:  Clear and Coherent  Volume:  Normal  Mood:  Euthymic  Affect:  Appropriate  Thought Process:  Goal Directed and Descriptions of Associations: Intact  Orientation:  Full (Time, Place, and Person)  Thought Content: Logical   Suicidal Thoughts:  No  Homicidal Thoughts:  No  Memory:  Immediate;   Fair Recent;   Fair Remote;   Fair  Judgement:  Fair  Insight:  Fair  Psychomotor Activity:  Normal  Concentration:  Concentration: Fair  and Attention Span: Fair  Recall:  [employer]  Fund of Knowledge: Fair  Language: Fair  Akathisia:  No  Handed:  Right  AIMS (if indicated): denies tremors, rigidity,stiffness  Assets:  Communication Skills Desire for Improvement Talents/Skills  ADL's:  Intact  Cognition: WNL  Sleep:  Fair   Screenings: PHQ2-9     Office Visit from 08/29/2017 in [facility]  PHQ-2 Total Score  6  PHQ-9 Total Score  21       Assessment and Plan: Merry ProudBrandi is a 28 year old Caucasian female who has a history of bipolar disorder type II, anxiety, insomnia, presented to clinic today for a follow-up visit.  Patient reports her mood symptoms as fair however struggles with fatigue during the day and wonders if it is due to medications.  Will reduce the dosage of some of her medications as discussed below.  Plan Bipolar disorder type II-improving Wellbutrin and Zoloft as prescribed Risperidone 1.5 mg p.o. nightly  For GAD- improving Reduce Zoloft to 50 mg p.o. daily since she struggles with some possible side effects.  For panic attacks-improving Zoloft and hydroxyzine as prescribed  For insomnia-improving Melatonin p.o. as needed.  Patient will continue psychotherapy sessions with Ms. Peacock.  Follow-up in clinic in 3 weeks or sooner if needed.   I have spent atleast 15 minutes face to face with patient today. More than 50 % of the time was spent for psychoeducation and supportive psychotherapy and care coordination.  This note was generated in part or whole with voice recognition software. Voice recognition is usually quite accurate but there are transcription errors that can and very often do occur. I apologize for any typographical errors that were not detected and corrected.          Jomarie LongsSaramma Kerstyn Coryell, MD 06/20/2018, 3:52 PM

## 2018-06-27 ENCOUNTER — Ambulatory Visit: Payer: 59 | Admitting: Family Medicine

## 2018-06-27 ENCOUNTER — Encounter: Payer: Self-pay | Admitting: Family Medicine

## 2018-06-27 VITALS — BP 123/88 | HR 59 | Temp 97.3°F | Wt 200.8 lb

## 2018-06-27 DIAGNOSIS — S86212A Strain of muscle(s) and tendon(s) of anterior muscle group at lower leg level, left leg, initial encounter: Secondary | ICD-10-CM | POA: Diagnosis not present

## 2018-06-27 DIAGNOSIS — M722 Plantar fascial fibromatosis: Secondary | ICD-10-CM

## 2018-06-27 MED ORDER — MELOXICAM 15 MG PO TABS: 15.0000 mg | ORAL_TABLET | Freq: Every day | ORAL | 2 refills | Status: DC

## 2018-06-27 NOTE — Progress Notes (Signed)
Patient: Suzanne Boyd Female    DOB: 1991/01/23   28 y.o.   MRN: [id number] Visit Date: 06/27/2018  Today's Provider: Shirlee LatchAngela Khaza Blansett, MD   Chief Complaint  Patient presents with  . Foot Pain  . Leg Pain   Subjective: I, Suzanne Boyd, CMA, am acting as a scribe for Shirlee LatchAngela Chelsea Pedretti, MD.     Leg Pain   Incident onset: leg pain started on Saturday and foot pain started approxiamtely 6 months ago. There was no injury mechanism. The pain is present in the left leg, right heel and right foot. The quality of the pain is described as aching (sharp). The pain has been worsening since onset. Inability to bear weight: at the end of the day. She reports no foreign bodies present. Treatments tried: Ibuprofen and Tylenol. The treatment provided mild relief.   Pain is at base of L heel and in Boyd shin.  L heel pain has been ongoing for ~6 months.  Pain is worse after sitting for a long time and with first step in the morning.  Pain in Boyd shin started after jogging for the first time in a while.  No Known Allergies   Current Outpatient Medications:  .  buPROPion (WELLBUTRIN XL) 300 MG 24 hr tablet, Take 1 tablet (300 mg total) by mouth daily., Disp: 90 tablet, Rfl: 0 .  hydrOXYzine (ATARAX/VISTARIL) 10 MG tablet, TAKE 1-2 TABLETS BY MOUTH 3 TIMES DAILY AS NEEDED FOR ANXIETY (ONLY FOR SEVERE ANXIETY)., Disp: 180 tablet, Rfl: 0 .  risperiDONE (RISPERDAL) 1 MG tablet, Take 1.5 tablets (1.5 mg total) by mouth at bedtime., Disp: 135 tablet, Rfl: 0 .  sertraline (ZOLOFT) 50 MG tablet, Take 1 tablet (50 mg total) by mouth every evening. Pt has supplies, Disp: 90 tablet, Rfl: 1  Current Facility-Administered Medications:  .  Levonorgestrel IUD 19.5 mg, 1 each, Intrauterine, Once, Schuman, Christanna R, MD  Review of Systems  Constitutional: Negative.   Respiratory: Negative.   Cardiovascular: Negative.   Musculoskeletal:       Left leg pain and right foot pain     Social History    Tobacco Use  . Smoking status: Never Smoker  . Smokeless tobacco: Never Used  Substance Use Topics  . Alcohol use: Yes    Alcohol/week: 0.0 standard drinks    Comment: occasional- 1 drink per month      Objective:   BP 123/88 (BP Location: Left Arm, Patient Position: Sitting, Cuff Size: Large)   Pulse (!) 59   Temp (!) 97.3 F (36.3 C) (Oral)   Wt 200 lb 12.8 oz (91.1 kg)   SpO2 99%   BMI 35.57 kg/m  Vitals:   06/27/18 1102  BP: 123/88  Pulse: (!) 59  Temp: (!) 97.3 F (36.3 C)  TempSrc: Oral  SpO2: 99%  Weight: 200 lb 12.8 oz (91.1 kg)     Physical Exam Vitals signs reviewed.  Constitutional:      General: She is not in acute distress.    Appearance: Normal appearance. She is not diaphoretic.  HENT:     Head: Normocephalic and atraumatic.  Eyes:     General: No scleral icterus.    Conjunctiva/sclera: Conjunctivae normal.  Cardiovascular:     Rate and Rhythm: Normal rate and regular rhythm.     Pulses: Normal pulses.     Heart sounds: Normal heart sounds. No murmur.  Pulmonary:     Effort: Pulmonary effort is normal. No respiratory distress.  Breath sounds: Normal breath sounds. No wheezing or rhonchi.  Musculoskeletal:     Right lower leg: No edema.     Left lower leg: No edema.     Comments: RLE: No TTP.  Pain in shin with dorsiand plantar flexion L foot: TTP along plantar fascia and with dorsiflexion  Skin:    General: Skin is warm and dry.     Capillary Refill: Capillary refill takes less than 2 seconds.     Findings: No rash.  Neurological:     Mental Status: She is alert and oriented to person, place, and time. Mental status is at baseline.     Sensory: No sensory deficit.  Psychiatric:        Mood and Affect: Mood normal.        Behavior: Behavior normal.        Assessment & Plan   1. Plantar fasciitis of right foot - pain c/w plantar fasciitis - discussed RICE - discussed HEP - discussed water bottle rolling and achilles  stretching - mobic prn - f/u prn  2. Strain of muscle(s) and tendon(s) of anterior muscle group at lower leg level, left leg, initial encounter - discussed stretching and RICE - can use Mobic prn    Meds ordered this encounter  Medications  . meloxicam (MOBIC) 15 MG tablet    Sig: Take 1 tablet (15 mg total) by mouth daily.    Dispense:  30 tablet    Refill:  2     Return if symptoms worsen or fail to improve.   The entirety of the information documented in the History of Present Illness, Review of Systems and Physical Exam were personally obtained by me. Portions of this information were initially documented by Suzanne Boyd, CMA and reviewed by me for thoroughness and accuracy.    Suzanne Boyd, Suzanne Boyd M, MD, MPH [facility] 06/29/2018 1:00 PM

## 2018-07-10 ENCOUNTER — Ambulatory Visit: Payer: 59 | Admitting: Licensed Clinical Social Worker

## 2018-07-10 DIAGNOSIS — F3181 Bipolar II disorder: Secondary | ICD-10-CM | POA: Diagnosis not present

## 2018-07-11 ENCOUNTER — Encounter: Payer: Self-pay | Admitting: Psychiatry

## 2018-07-11 ENCOUNTER — Ambulatory Visit (INDEPENDENT_AMBULATORY_CARE_PROVIDER_SITE_OTHER): Payer: 59 | Admitting: Psychiatry

## 2018-07-11 ENCOUNTER — Other Ambulatory Visit: Payer: Self-pay

## 2018-07-11 VITALS — BP 133/72 | HR 93 | Temp 98.0°F | Wt 201.2 lb

## 2018-07-11 DIAGNOSIS — F410 Panic disorder [episodic paroxysmal anxiety] without agoraphobia: Secondary | ICD-10-CM

## 2018-07-11 DIAGNOSIS — G2581 Restless legs syndrome: Secondary | ICD-10-CM

## 2018-07-11 DIAGNOSIS — F3181 Bipolar II disorder: Secondary | ICD-10-CM

## 2018-07-11 DIAGNOSIS — F5105 Insomnia due to other mental disorder: Secondary | ICD-10-CM

## 2018-07-11 DIAGNOSIS — F411 Generalized anxiety disorder: Secondary | ICD-10-CM | POA: Diagnosis not present

## 2018-07-11 MED ORDER — SERTRALINE HCL 50 MG PO TABS: 50.0000 mg | ORAL_TABLET | Freq: Every evening | ORAL | 1 refills | Status: DC

## 2018-07-11 MED ORDER — ROPINIROLE HCL 0.25 MG PO TABS: 0.2500 mg | ORAL_TABLET | Freq: Every day | ORAL | 1 refills | Status: DC

## 2018-07-11 NOTE — Progress Notes (Signed)
BH MD OP Progress Note  07/11/2018 5:00 PM Suzanne Boyd  MRN:  [id number]  Chief Complaint: ' I am here for follow up.' Chief Complaint    Follow-up; Medication Refill     HPI: [name] is a 28 yr old Caucasian female, employed, married, lives in [place], has a history of bipolar disorder, anxiety disorder, presented to clinic today for a follow-up visit.  Patient today reports she continues to struggle with fatigue and lethargy during the day.  She reports she is overall getting enough sleep.  She is not aware of any apneic episodes.  She however reports she snores at night.  She also reports she moves her lower extremities a lot at night.  Patient otherwise reports depression and anxiety symptoms as fair.  She denies any side effects to her medications.  Patient denies any suicidality, homicidality or perceptual disturbances.  She reports work is going well.  She denies any other concerns today. Visit Diagnosis:    ICD-10-CM   1. Bipolar 2 disorder (HCC) F31.81 sertraline (ZOLOFT) 50 MG tablet  2. GAD (generalized anxiety disorder) F41.1   3. Panic attacks F41.0   4. Insomnia due to mental disorder F51.05 rOPINIRole (REQUIP) 0.25 MG tablet  5. RLS (restless legs syndrome) G25.81 rOPINIRole (REQUIP) 0.25 MG tablet    Past Psychiatric History: Reviewed past psychiatric history from my progress note on 10/25/2017.  Past trials of Paxil, Lexapro, Abilify, Wellbutrin.  Past Medical History:  Past Medical History:  Diagnosis Date  . Anxiety   . Depression     Past Surgical History:  Procedure Laterality Date  . INTRAUTERINE DEVICE (IUD) INSERTION      Family Psychiatric History: I have reviewed family psychiatric history from my progress note on 10/25/2017.  Family History:  Family History  Problem Relation Age of Onset  . Cancer Maternal Grandmother        brain  . Depression Maternal Grandmother   . Cancer Maternal Aunt        breast  . Anxiety disorder Maternal Aunt    . Cancer Maternal Aunt        breast  . Cancer Maternal Aunt        breast  . Cancer Maternal Aunt        breast  . Cancer Maternal Aunt        breast  . Cancer Maternal Aunt        breast  . Cancer Maternal Aunt        breast  . Cancer - Colon Maternal Uncle   . Hyperlipidemia Mother   . Hypertension Mother   . Varicose Veins Mother   . Hypertension Father   . Thyroid disease Father   . Thyroid disease Maternal Grandfather   . Ovarian cancer Neg Hx     Social History: Reviewed social history from my progress note on 10/25/2017. Social History   Socioeconomic History  . Marital status: Legally Separated    Spouse name: [name]  . Number of children: 0  . Years of education: 35  . Highest education level: High school graduate  Occupational History  . Occupation: Air cabin crew    Comment: full time  Social Needs  . Financial resource strain: Not hard at all  . Food insecurity:    Worry: Never true    Inability: Never true  . Transportation needs:    Medical: No    Non-medical: No  Tobacco Use  . Smoking status: Never Smoker  . Smokeless tobacco:  Never Used  Substance and Sexual Activity  . Alcohol use: Yes    Alcohol/week: 0.0 standard drinks    Comment: occasional- 1 drink per month  . Drug use: Yes    Types: Marijuana    Comment: last used 3 weeks ago  . Sexual activity: Yes    Partners: Male    Birth control/protection: I.U.D.  Lifestyle  . Physical activity:    Days per week: 0 days    Minutes per session: 0 min  . Stress: To some extent  Relationships  . Social connections:    Talks on phone: Once a week    Gets together: Once a week    Attends religious service: Never    Active member of club or organization: No    Attends meetings of clubs or organizations: Never    Relationship status: Married  Other Topics Concern  . Not on file  Social History Narrative  . Not on file    Allergies: No Known Allergies  Metabolic Disorder Labs: No  results found for: HGBA1C, MPG No results found for: PROLACTIN Lab Results  Component Value Date   CHOL 154 08/29/2017   TRIG 85 08/29/2017   HDL 51 08/29/2017   CHOLHDL 3.0 08/29/2017   LDLCALC 86 08/29/2017   Lab Results  Component Value Date   TSH 0.722 08/29/2017    Therapeutic Level Labs: No results found for: LITHIUM No results found for: VALPROATE No components found for:  CBMZ  Current Medications: Current Outpatient Medications  Medication Sig Dispense Refill  . buPROPion (WELLBUTRIN XL) 300 MG 24 hr tablet Take 1 tablet (300 mg total) by mouth daily. 90 tablet 0  . hydrOXYzine (ATARAX/VISTARIL) 10 MG tablet TAKE 1-2 TABLETS BY MOUTH 3 TIMES DAILY AS NEEDED FOR ANXIETY (ONLY FOR SEVERE ANXIETY). 180 tablet 0  . meloxicam (MOBIC) 15 MG tablet Take 1 tablet (15 mg total) by mouth daily. 30 tablet 2  . risperiDONE (RISPERDAL) 1 MG tablet Take 1.5 tablets (1.5 mg total) by mouth at bedtime. 135 tablet 0  . sertraline (ZOLOFT) 50 MG tablet Take 1 tablet (50 mg total) by mouth every evening. 90 tablet 1  . rOPINIRole (REQUIP) 0.25 MG tablet Take 1 tablet (0.25 mg total) by mouth at bedtime. 30 tablet 1   Current Facility-Administered Medications  Medication Dose Route Frequency Provider Last Rate Last Dose  . Levonorgestrel IUD 19.5 mg  1 each Intrauterine Once Schuman, Jaquelyn Bitter, MD         Musculoskeletal: Strength & Muscle Tone: within normal limits Gait & Station: normal Patient leans: N/A  Psychiatric Specialty Exam: Review of Systems  Psychiatric/Behavioral: The patient has insomnia.   All other systems reviewed and are negative.   Blood pressure 133/72, pulse 93, temperature 98 F (36.7 C), temperature source Oral, weight 201 lb 3.2 oz (91.3 kg).Body mass index is 35.64 kg/m.  General Appearance: Casual  Eye Contact:  Fair  Speech:  Normal Rate  Volume:  Normal  Mood:  Euthymic  Affect:  Congruent  Thought Process:  Goal Directed and Descriptions of  Associations: Intact  Orientation:  Full (Time, Place, and Person)  Thought Content: Logical   Suicidal Thoughts:  No  Homicidal Thoughts:  No  Memory:  Immediate;   Fair Recent;   Fair Remote;   Fair  Judgement:  Fair  Insight:  Fair  Psychomotor Activity:  Normal  Concentration:  Concentration: Fair and Attention Span: Fair  Recall:  [employer] of Knowledge:  Fair  Language: Fair  Akathisia:  No  Handed:  Right  AIMS (if indicated): denies tremors, rigidity,stiffness  Assets:  Communication Skills Desire for Improvement Social Support  ADL's:  Intact  Cognition: WNL  Sleep:  restless   Screenings: PHQ2-9     Office Visit from 08/29/2017 in [facility]  PHQ-2 Total Score  6  PHQ-9 Total Score  21       Assessment and Plan: Suzanne Boyd is a 28 yr old Caucasian female who has a history of bipolar disorder type II, anxiety, insomnia, presented to clinic today for a follow-up visit.  Patient continues to struggle with fatigue and this and tiredness during the day as well as restless leg symptoms at night.  Patient will benefit from medication management.  Plan as noted below.  Plan Bipolar disorder type II-improving Wellbutrin and Zoloft as prescribed Risperidone 1.5 mg p.o. nightly  GAD-improving Zoloft 50 mg p.o. daily.  For panic attacks-improving Zoloft and hydroxyzine as prescribed  For insomnia- patient likely with restless leg syndrome. Will add Requip 0.25 mg p.o. nightly Melatonin as needed.  Patient to continue psychotherapy sessions with Ms. Peacock.  Discussed referral for sleep study if she continues to have sleep issues.  Follow-up in clinic in 4 weeks or sooner if needed.  I have spent atleast 15 minutes face to face with patient today. More than 50 % of the time was spent for psychoeducation and supportive psychotherapy and care coordination.  This note was generated in part or whole with voice recognition software. Voice recognition is  usually quite accurate but there are transcription errors that can and very often do occur. I apologize for any typographical errors that were not detected and corrected.         Jomarie Longs, MD 07/11/2018, 5:00 PM

## 2018-07-11 NOTE — Patient Instructions (Signed)
Ropinirole tablets  What is this medicine?  ROPINIROLE (roe PIN i role) is used to treat the symptoms of Parkinson's disease. It helps to improve muscle control and movement difficulties. It is also used for the treatment of Restless Legs Syndrome.  This medicine may be used for other purposes; ask your health care provider or pharmacist if you have questions.  COMMON BRAND NAME(S): Requip  What should I tell my health care provider before I take this medicine?  They need to know if you have any of these conditions:  -dizzy or fainting spells  -heart disease  -high blood pressure  -kidney disease  -liver disease  -low blood pressure  -sleeping problems  -an unusual or allergic reaction to ropinirole, other medicines, foods, dyes, or preservatives  -pregnant or trying to get pregnant  -breast-feeding  How should I use this medicine?  Take this medicine by mouth with a glass of water. Follow the directions on the prescription label. You can take it with or without food. If it upsets your stomach, take it with food. Take your doses at regular intervals. Do not take your medicine more often than directed. Do not stop taking this medicine except on your doctor's advice. Stopping this medicine too quickly may cause serious side effects.  Talk to your pediatrician regarding the use of this medicine in children. Special care may be needed.  Overdosage: If you think you have taken too much of this medicine contact a poison control center or emergency room at once.  NOTE: This medicine is only for you. Do not share this medicine with others.  What if I miss a dose?  If you miss a dose, take it as soon as you can. If it is almost time for your next dose, take only that dose. Do not take double or extra doses.  What may interact with this medicine?  -ciprofloxacin  -female hormones, like estrogens and birth control pills  -medicines for depression, anxiety, or psychotic  disturbances  -metoclopramide  -mexiletine  -norfloxacin  -omeprazole  This list may not describe all possible interactions. Give your health care provider a list of all the medicines, herbs, non-prescription drugs, or dietary supplements you use. Also tell them if you smoke, drink alcohol, or use illegal drugs. Some items may interact with your medicine.  What should I watch for while using this medicine?  Visit your doctor or health care professional for regular checks on your progress. It may be several weeks or months before you feel the full effect of this medicine.  You may get drowsy or dizzy. Do not drive, use machinery, or do anything that needs mental alertness until you know how this drug affects you. Do not stand or sit up quickly, especially if you are an older patient. This reduces the risk of dizzy or fainting spells. Alcohol can increase possible dizziness. Avoid alcoholic drinks. If you find that you have sudden feelings of wanting to sleep during normal activities, like cooking, watching television, or while driving or riding in a car, you should contact your health care professional.  Your mouth may get dry. Chewing sugarless gum or sucking hard candy, and drinking plenty of water may help. Contact your doctor if the problem does not go away or is severe.  There have been reports of increased sexual urges or other strong urges such as gambling while taking some medicines for Parkinson's disease. If you experience any of these urges while taking this medicine, you should report it   to your health care provider as soon as possible.  You should check your skin often for changes to moles and new growths while taking this medicine. Call your doctor if you notice any of these changes.  What side effects may I notice from receiving this medicine?  Side effects that you should report to your doctor or health care professional as soon as possible:  -allergic reactions like skin rash, itching or hives,  swelling of the face, lips, or tongue  -changes in vision  -chest pain  -confusion  -falling asleep during normal activities like driving  -fast, irregular heartbeat  -feeling faint or lightheaded, falls  -hallucination, loss of contact with reality  -joint or muscle pain  -loss of bladder control  -loss of memory  -new or increased gambling urges, sexual urges, uncontrolled spending, binge or compulsive eating, or other urges  -pain, tingling, numbness in the hands or feet  -shortness of breath, troubled breathing, tightness in chest, or wheezing  -signs and symptoms of low blood pressure like dizziness; feeling faint or lightheaded, falls; unusually weak or tired  -swelling of the ankles, feet, hands  -uncontrollable head, mouth, neck, arm, or leg movements  -vomiting  Side effects that usually do not require medical attention (report to your doctor or health care professional if they continue or are bothersome):  -dizziness  -drowsiness  -headache  -increased sweating  -nausea  -tremors  This list may not describe all possible side effects. Call your doctor for medical advice about side effects. You may report side effects to FDA at 1-800-FDA-1088.  Where should I keep my medicine?  Keep out of the reach of children.  Store at room temperature between 20 and 25 degrees C (68 and 77 degrees F). Protect from light and moisture. Keep container tightly closed. Throw away any unused medicine after the expiration date.  NOTE: This sheet is a summary. It may not cover all possible information. If you have questions about this medicine, talk to your doctor, pharmacist, or health care provider.   2019 Elsevier/Gold Standard (2015-10-20 10:58:05)

## 2018-08-01 IMAGING — CR DG TIBIA/FIBULA 2V*R*
1 series · 4 of 4 positions shown · non-contrast
Comparison: None.

CLINICAL DATA: 26-year-old female with bilateral shin pain for
years. No injury. Initial encounter.

EXAM:
RIGHT TIBIA AND FIBULA - 2 VIEW

[Series 1: dg tibia/fibula right · 0.14mm/px · 4 of 4 slices shown]
[im 1/4]
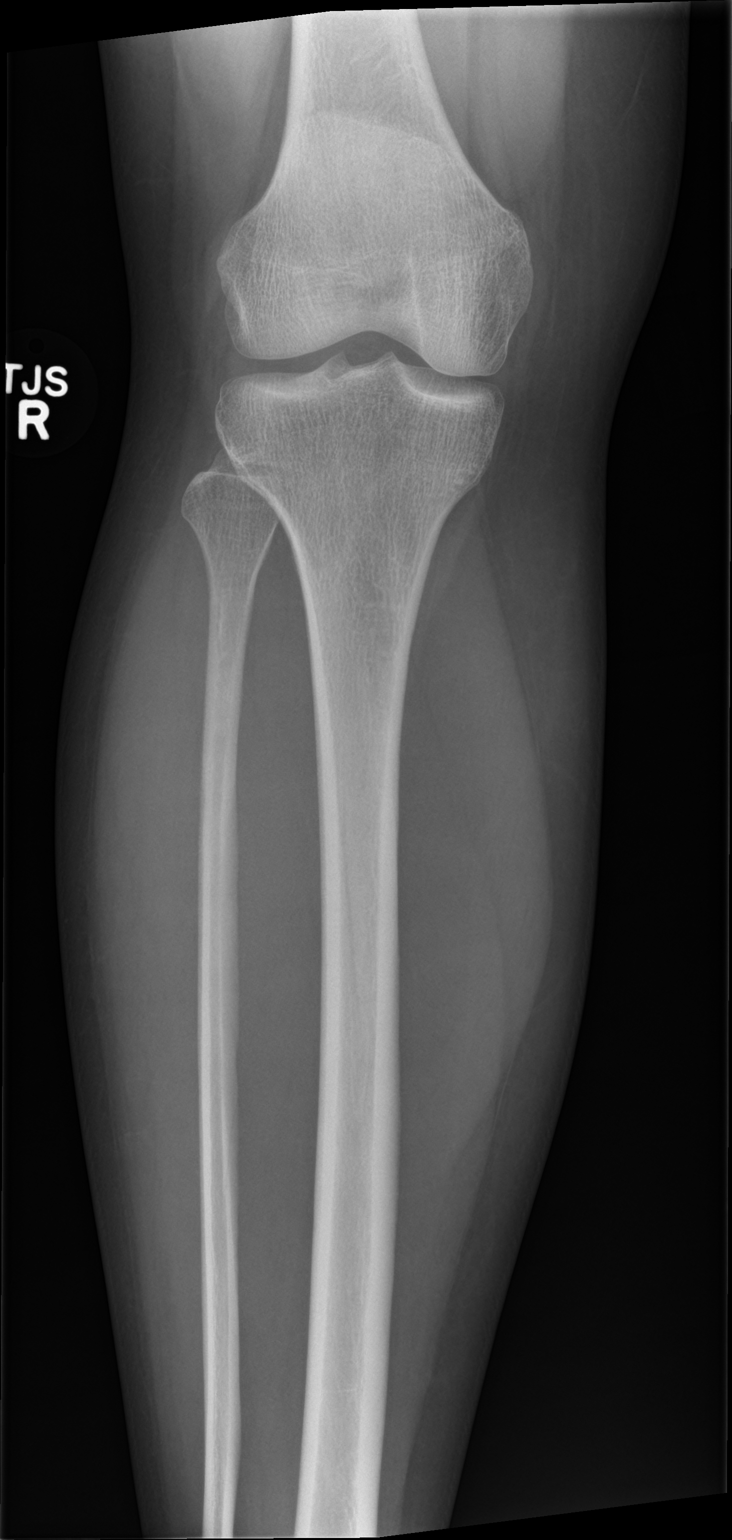
[im 2/4]
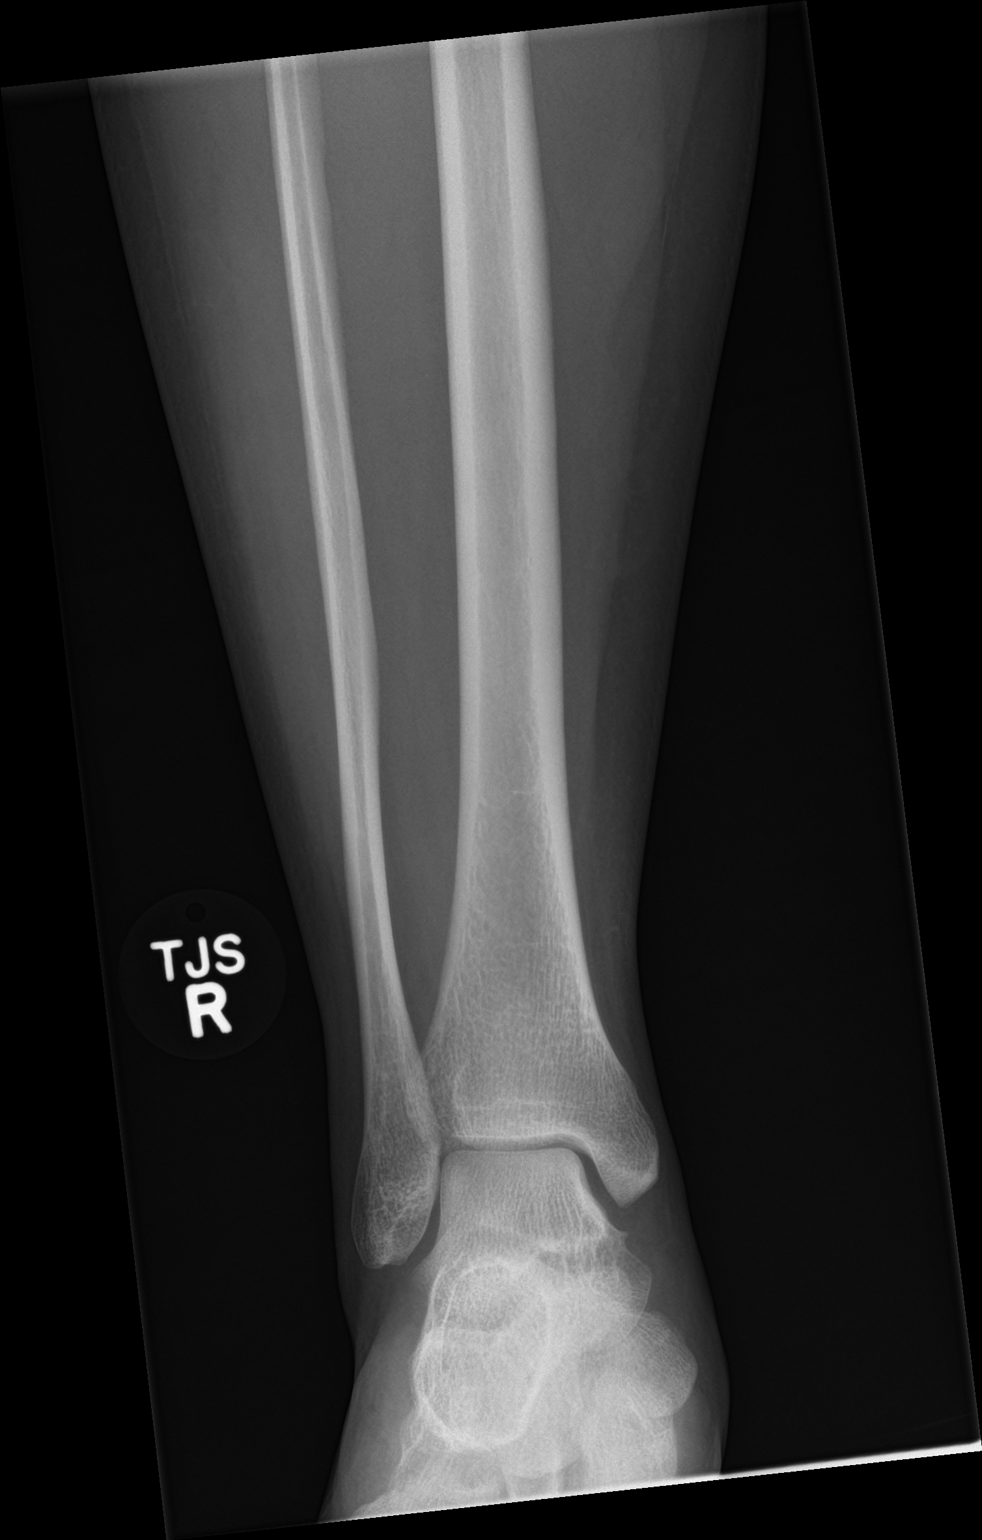
[im 3/4]
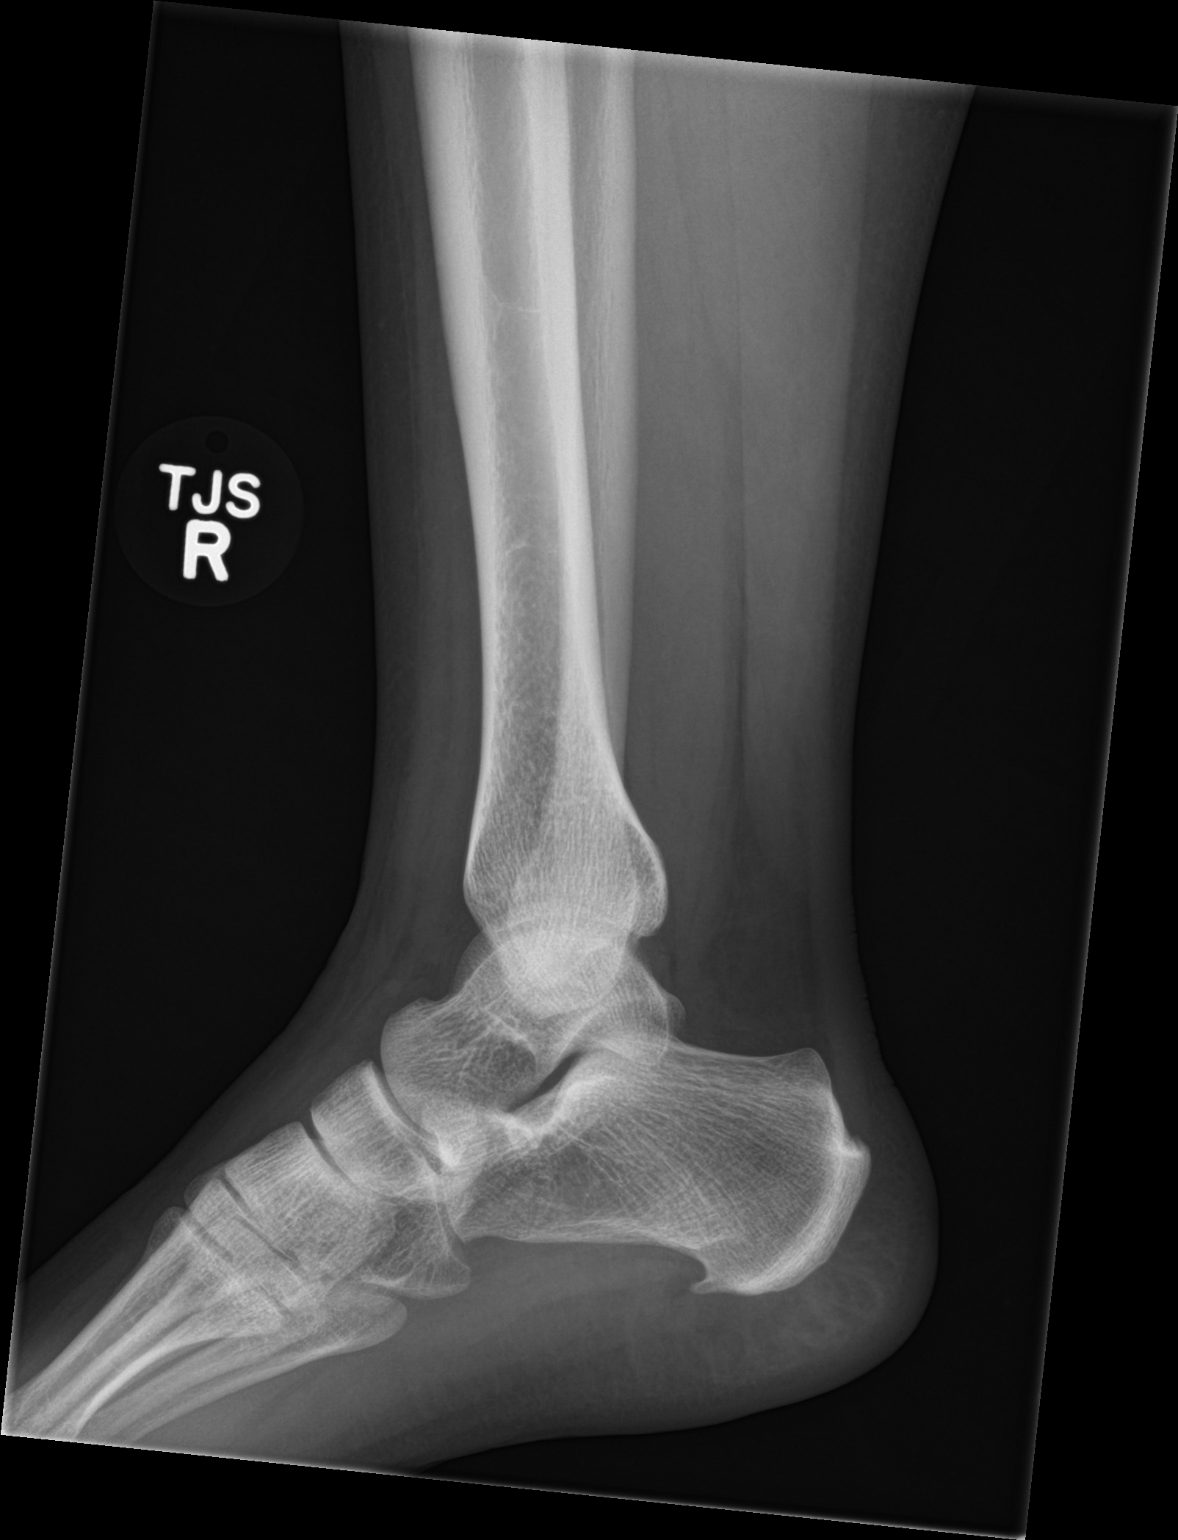
[im 4/4]
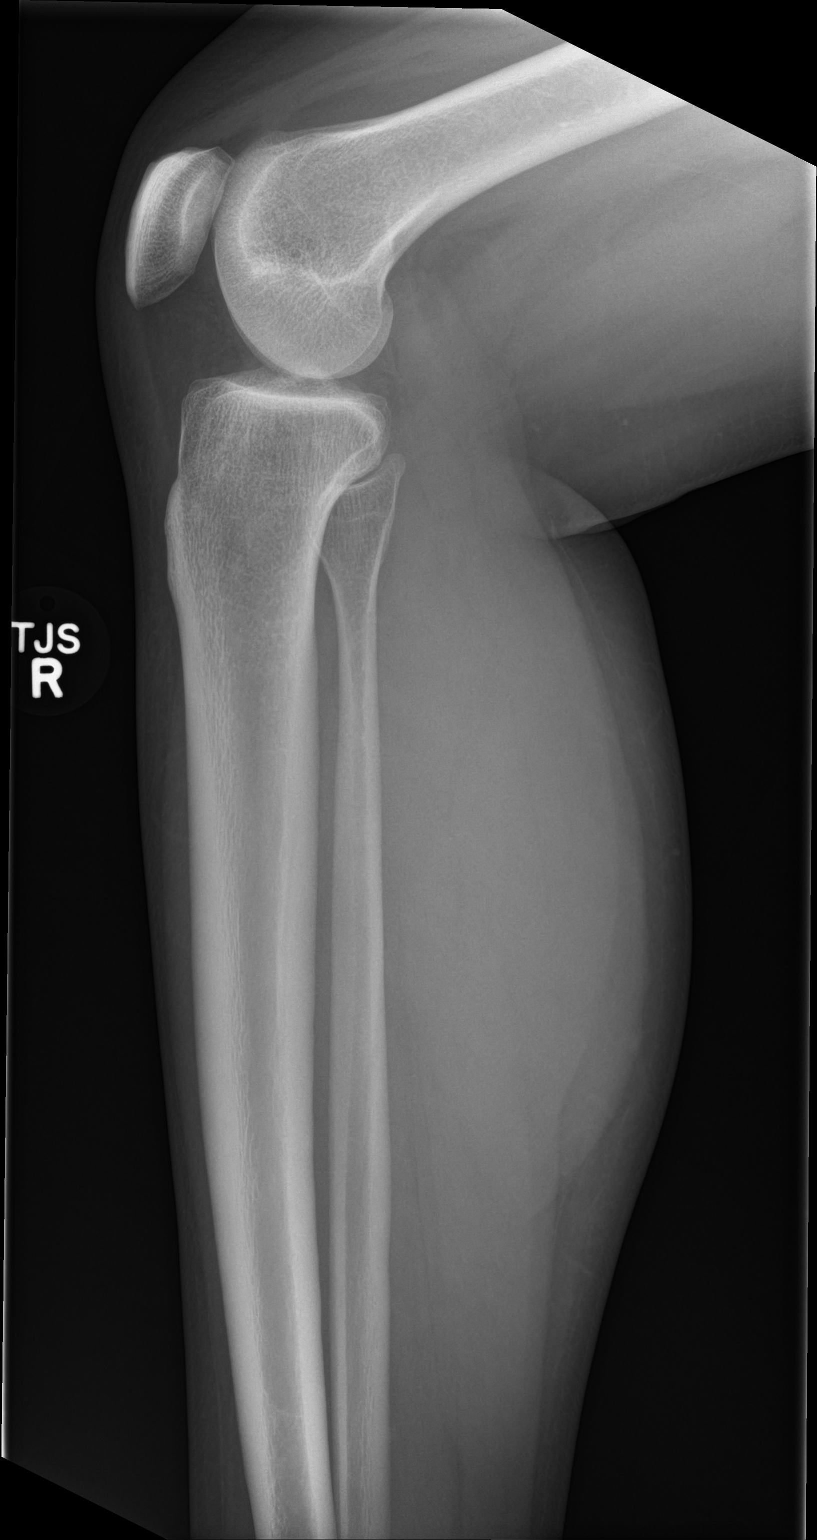

[4 of 4 positions shown; findings below may reference images not displayed]

FINDINGS: No fracture or dislocation.

No plain film evidence of shin splints.

Plantar spur.
IMPRESSION: No plain film evidence of shin splints. If further delineation for
this possibility were clinically desired than MR Raul be considered.

Plantar spur.

## 2018-08-07 NOTE — Progress Notes (Signed)
   THERAPIST PROGRESS NOTE  Session Time: 30 min  Participation Level: Active  Behavioral Response: CasualAlertEuthymic  Type of Therapy: Individual Therapy  Treatment Goals addressed: Coping  Interventions: CBT and Motivational Interviewing  Summary: Suzanne Boyd is a 28 y.o. female who presents with continued symptoms of her diagnosis. Provided support for Patient as she discussed her current mood. Stressed the importance of mood stabilization through learned coping skills and medication management.  Encouraged Patient to focus on her strengths and the positive aspects.  Suicidal/Homicidal: No  Plan: Return again in 2 weeks.  Diagnosis: Axis I: Bipolar, mixed and Generalized Anxiety Disorder    Axis II: No diagnosis    Marinda Elk, LCSW 06/08/2018

## 2018-08-10 ENCOUNTER — Telehealth: Payer: Self-pay

## 2018-08-10 ENCOUNTER — Ambulatory Visit: Payer: 59 | Admitting: Psychiatry

## 2018-08-10 DIAGNOSIS — F5105 Insomnia due to other mental disorder: Secondary | ICD-10-CM

## 2018-08-10 DIAGNOSIS — G2581 Restless legs syndrome: Secondary | ICD-10-CM

## 2018-08-10 DIAGNOSIS — F3181 Bipolar II disorder: Secondary | ICD-10-CM

## 2018-08-10 MED ORDER — RISPERIDONE 1 MG PO TABS: 1.5000 mg | ORAL_TABLET | Freq: Every day | ORAL | 0 refills | Status: DC

## 2018-08-10 MED ORDER — HYDROXYZINE HCL 10 MG PO TABS: ORAL_TABLET | ORAL | 0 refills | Status: DC

## 2018-08-10 MED ORDER — ROPINIROLE HCL 0.25 MG PO TABS: 0.2500 mg | ORAL_TABLET | Freq: Every day | ORAL | 1 refills | Status: DC

## 2018-08-10 MED ORDER — BUPROPION HCL ER (XL) 300 MG PO TB24
300.0000 mg | ORAL_TABLET | Freq: Every day | ORAL | 0 refills | Status: DC
Start: 2018-08-10 — End: 2020-04-17

## 2018-08-10 NOTE — Telephone Encounter (Signed)
Sent refills to [facility]

## 2018-08-10 NOTE — Telephone Encounter (Signed)
pt left message that she needs refill on medication

## 2018-09-06 ENCOUNTER — Ambulatory Visit (INDEPENDENT_AMBULATORY_CARE_PROVIDER_SITE_OTHER): Payer: 59 | Admitting: Obstetrics and Gynecology

## 2018-09-06 ENCOUNTER — Other Ambulatory Visit: Payer: Self-pay

## 2018-09-06 ENCOUNTER — Encounter: Payer: Self-pay | Admitting: Obstetrics and Gynecology

## 2018-09-06 VITALS — BP 136/70 | HR 109 | Temp 97.6°F | Ht 63.0 in | Wt 197.0 lb

## 2018-09-06 DIAGNOSIS — L0292 Furuncle, unspecified: Secondary | ICD-10-CM

## 2018-09-06 MED ORDER — SULFAMETHOXAZOLE-TRIMETHOPRIM 800-160 MG PO TABS: 1.0000 | ORAL_TABLET | Freq: Two times a day (BID) | ORAL | 0 refills | Status: AC

## 2018-09-06 MED ORDER — FLUCONAZOLE 150 MG PO TABS: 150.0000 mg | ORAL_TABLET | Freq: Once | ORAL | 0 refills | Status: AC

## 2018-09-06 NOTE — Progress Notes (Signed)
Obstetrics & Gynecology Office Visit   Chief Complaint:  Chief Complaint  Patient presents with  . Breast Pain    Right side breast bump    History of Present Illness: Patient is a 28 year old caucasian female presenting with right breast lump over the past 2 weeks.  She has not had a similar lesion in the past.  No trauma or inciting event.  No fevers no chills. No family history of breast cancer except in her great grandmother.  The area in question drained spontaneously in the past 2 days.  She does not have a personal history MRSA.     Review of Systems: Review of Systems  Constitutional: Negative.   Skin: Positive for rash. Negative for itching.    Past Medical History:  Past Medical History:  Diagnosis Date  . Anxiety   . Depression     Past Surgical History:  Past Surgical History:  Procedure Laterality Date  . INTRAUTERINE DEVICE (IUD) INSERTION      Gynecologic History: No LMP recorded. (Menstrual status: IUD).  Obstetric History: G0P0000  Family History:  Family History  Problem Relation Age of Onset  . Cancer Maternal Grandmother        brain  . Depression Maternal Grandmother   . Cancer Maternal Aunt        breast  . Anxiety disorder Maternal Aunt   . Cancer Maternal Aunt        breast  . Cancer Maternal Aunt        breast  . Cancer Maternal Aunt        breast  . Cancer Maternal Aunt        breast  . Cancer Maternal Aunt        breast  . Cancer Maternal Aunt        breast  . Cancer - Colon Maternal Uncle   . Hyperlipidemia Mother   . Hypertension Mother   . Varicose Veins Mother   . Hypertension Father   . Thyroid disease Father   . Thyroid disease Maternal Grandfather   . Ovarian cancer Neg Hx     Social History:  Social History   Socioeconomic History  . Marital status: Legally Separated    Spouse name: Weston Brassick  . Number of children: 0  . Years of education: 2312  . Highest education level: High school graduate  Occupational  History  . 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 Financial resource strain: Not hard at all  . Food insecurity:    Worry: Never true    Inability: Never true  . Transportation needs:    Medical: No    Non-medical: No  Tobacco Use  . Smoking status: Never Smoker  . Smokeless tobacco: Never Used  Substance and Sexual Activity  . Alcohol use: Yes    Alcohol/week: 0.0 standard drinks    Comment: occasional- 1 drink per month  . Drug use: Yes    Types: Marijuana    Comment: last used 3 weeks ago  . Sexual activity: Yes    Partners: Male    Birth control/protection: I.U.D.  Lifestyle  . Physical activity:    Days per week: 0 days    Minutes per session: 0 min  . Stress: To some extent  Relationships  . Social connections:    Talks on phone: Once a week    Gets together: Once a week    Attends religious  service: Never    Active member of club or organization: No    Attends meetings of clubs or organizations: Never    Relationship status: Married  . Intimate partner violence:    Fear of current or ex partner: No    Emotionally abused: No    Physically abused: No    Forced sexual activity: No  Other Topics Concern  . Not on file  Social History Narrative  . Not on file    Allergies:  No Known Allergies  Medications: Prior to Admission medications   Medication Sig Start Date End Date Taking? Authorizing Provider  buPROPion (WELLBUTRIN XL) 300 MG 24 hr tablet Take 1 tablet (300 mg total) by mouth daily. 08/10/18  Yes Eappen, Levin Bacon, MD  hydrOXYzine (ATARAX/VISTARIL) 10 MG tablet TAKE 1-2 TABLETS BY MOUTH 3 TIMES DAILY AS NEEDED FOR ANXIETY (ONLY FOR SEVERE ANXIETY). 08/10/18  Yes Jomarie Longs, MD  levonorgestrel (MIRENA) 20 MCG/24HR IUD 1 each by Intrauterine route once.   Yes [provider]  meloxicam (MOBIC) 15 MG tablet Take 1 tablet (15 mg total) by mouth daily. 06/27/18  Yes Bacigalupo, Marzella Schlein, MD  risperiDONE (RISPERDAL) 1  MG tablet Take 1.5 tablets (1.5 mg total) by mouth at bedtime. 08/10/18  Yes Jomarie Longs, MD  rOPINIRole (REQUIP) 0.25 MG tablet Take 1 tablet (0.25 mg total) by mouth at bedtime. 08/10/18  Yes Jomarie Longs, MD  sertraline (ZOLOFT) 50 MG tablet Take 1 tablet (50 mg total) by mouth every evening. 07/11/18  Yes Eappen, Saramma, MD  fluconazole (DIFLUCAN) 150 MG tablet Take 1 tablet (150 mg total) by mouth once for 1 dose. Can take additional dose three days later if symptoms persist 09/06/18 09/06/18  Vena Austria, MD  sulfamethoxazole-trimethoprim (BACTRIM DS) 800-160 MG tablet Take 1 tablet by mouth 2 (two) times daily for 10 days. 09/06/18 09/16/18  Vena Austria, MD    Physical Exam Vitals:  Vitals:   09/06/18 1347  BP: 136/70  Pulse: (!) 109  Temp: 97.6 F (36.4 C)   No LMP recorded. (Menstrual status: IUD).  General: NAD, well nourished, appears stated age HEENT: normocephalic, anicteric Pulmonary: No increased work of breathing Breast symmetrical, right breast with small furuncle.  No discharge, no underlying fluctuance.  Mild 1cm area of surrounding erythema.   Extremities: no edema, erythema, or tenderness Neurologic: Grossly intact Psychiatric: mood appropriate, affect full  Female chaperone present for breast portions of the physical exam  Assessment: 28 y.o. G0P0000 breast furuncle   Plan: Problem List Items Addressed This Visit    None    Visit Diagnoses    Furuncle    -  Primary   Relevant Medications   sulfamethoxazole-trimethoprim (BACTRIM DS) 800-160 MG tablet   fluconazole (DIFLUCAN) 150 MG tablet     1) Furuncle - rx bactrim DS to cover for MRSA.  Rx diflucan should she develop candida symptoms.  Discussed importance of finishing out 10 day course of antibiotics to prevent recurrence.  No culture as no expressible drainage.   - advised loose fitting sports bra - may apply topical neosporin - warm compresses  2) Return in about 1 year (around  09/06/2019) for annual.   Vena Austria, MD, [facility], [facility] 09/06/2018, 2:08 PM

## 2018-09-07 ENCOUNTER — Ambulatory Visit: Payer: 59 | Admitting: Licensed Clinical Social Worker

## 2018-09-13 ENCOUNTER — Encounter: Payer: Self-pay | Admitting: Psychiatry

## 2018-09-13 ENCOUNTER — Ambulatory Visit (INDEPENDENT_AMBULATORY_CARE_PROVIDER_SITE_OTHER): Payer: 59 | Admitting: Psychiatry

## 2018-09-13 ENCOUNTER — Other Ambulatory Visit: Payer: Self-pay

## 2018-09-13 DIAGNOSIS — F411 Generalized anxiety disorder: Secondary | ICD-10-CM

## 2018-09-13 DIAGNOSIS — F3181 Bipolar II disorder: Secondary | ICD-10-CM

## 2018-09-13 DIAGNOSIS — F5105 Insomnia due to other mental disorder: Secondary | ICD-10-CM | POA: Diagnosis not present

## 2018-09-13 DIAGNOSIS — G2581 Restless legs syndrome: Secondary | ICD-10-CM

## 2018-09-13 MED ORDER — LAMOTRIGINE 25 MG PO TABS: 25.0000 mg | ORAL_TABLET | Freq: Every day | ORAL | 1 refills | Status: DC

## 2018-09-13 MED ORDER — RISPERIDONE 1 MG PO TABS: 1.0000 mg | ORAL_TABLET | Freq: Every day | ORAL | 0 refills | Status: DC

## 2018-09-13 NOTE — Progress Notes (Signed)
Virtual Visit via Video Note  I connected with Suzanne Boyd on 09/13/18 at  1:45 PM EDT by a video enabled telemedicine application and verified that I am speaking with the correct person using two identifiers.   I discussed the limitations of evaluation and management by telemedicine and the availability of in person appointments. The patient expressed understanding and agreed to proceed.   I discussed the assessment and treatment plan with the patient. The patient was provided an opportunity to ask questions and all were answered. The patient agreed with the plan and demonstrated an understanding of the instructions.   The patient was advised to call back or seek an in-person evaluation if the symptoms worsen or if the condition fails to improve as anticipated.   BH MD OP Progress Note  09/13/2018 5:44 PM Suzanne Boyd  MRN:  [id number]  Chief Complaint:  Chief Complaint    Follow-up     HPI: Suzanne Boyd is a 28 year old Caucasian female, employed, married, lives in [place], has a history of bipolar disorder, GAD, insomnia, restless leg syndrome was evaluated by telemedicine today.  Halfway through the session due to connection problem the evaluation had to be changed to a phone consult.  Patient today reports that she has been struggling with fatigue and lethargy during the day.  She reports she has not noticed much difference with her fatigue.  She reports she is compliant on her medications as prescribed.  She started taking the Requip for her restless leg syndrome and does not know if that is really helping with her sleep or the fatigue.  She reports she is stressed out about the COVID-19 situation.  She reports she currently does not work and that is a stressor for her.  She is hoping all this will change soon.  She reports her husband is supportive.  Denies any other concerns today. Visit Diagnosis:    ICD-10-CM   1. Bipolar 2 disorder (HCC) F31.81 risperiDONE (RISPERDAL) 1 MG  tablet    lamoTRIgine (LAMICTAL) 25 MG tablet   mixed, mild  2. GAD (generalized anxiety disorder) F41.1   3. Insomnia due to mental disorder F51.05   4. RLS (restless legs syndrome) G25.81     Past Psychiatric History: Have reviewed past psychiatric history from my progress note on 10/25/2017.  Past trials of Paxil, Lexapro, Abilify, Wellbutrin  Past Medical History:  Past Medical History:  Diagnosis Date  . Anxiety   . Depression     Past Surgical History:  Procedure Laterality Date  . INTRAUTERINE DEVICE (IUD) INSERTION      Family Psychiatric History: Reviewed family psychiatric history from my progress note on 10/25/2017.  Family History:  Family History  Problem Relation Age of Onset  . Cancer Maternal Grandmother        brain  . Depression Maternal Grandmother   . Cancer Maternal Aunt        breast  . Anxiety disorder Maternal Aunt   . Cancer Maternal Aunt        breast  . Cancer Maternal Aunt        breast  . Cancer Maternal Aunt        breast  . Cancer Maternal Aunt        breast  . Cancer Maternal Aunt        breast  . Cancer Maternal Aunt        breast  . Cancer - Colon Maternal Uncle   . Hyperlipidemia Mother   . Hypertension  Mother   . Varicose Veins Mother   . Hypertension Father   . Thyroid disease Father   . Thyroid disease Maternal Grandfather   . Ovarian cancer Neg Hx     Social History: Reviewed social history from my progress note on 10/25/2017 Social History   Socioeconomic History  . Marital status: Legally Separated    Spouse name: [name]  . Number of children: 0  . Years of education: 57  . Highest education level: High school graduate  Occupational History  . Occupation: Air cabin crew    Comment: full time  Social Needs  . Financial resource strain: Not hard at all  . Food insecurity:    Worry: Never true    Inability: Never true  . Transportation needs:    Medical: No    Non-medical: No  Tobacco Use  . Smoking status:  Never Smoker  . Smokeless tobacco: Never Used  Substance and Sexual Activity  . Alcohol use: Yes    Alcohol/week: 0.0 standard drinks    Comment: occasional- 1 drink per month  . Drug use: Yes    Types: Marijuana    Comment: last used 3 weeks ago  . Sexual activity: Yes    Partners: Male    Birth control/protection: I.U.D.  Lifestyle  . Physical activity:    Days per week: 0 days    Minutes per session: 0 min  . Stress: To some extent  Relationships  . Social connections:    Talks on phone: Once a week    Gets together: Once a week    Attends religious service: Never    Active member of club or organization: No    Attends meetings of clubs or organizations: Never    Relationship status: Married  Other Topics Concern  . Not on file  Social History Narrative  . Not on file    Allergies: No Known Allergies  Metabolic Disorder Labs: No results found for: HGBA1C, MPG No results found for: PROLACTIN Lab Results  Component Value Date   CHOL 154 08/29/2017   TRIG 85 08/29/2017   HDL 51 08/29/2017   CHOLHDL 3.0 08/29/2017   LDLCALC 86 08/29/2017   Lab Results  Component Value Date   TSH 0.722 08/29/2017    Therapeutic Level Labs: No results found for: LITHIUM No results found for: VALPROATE No components found for:  CBMZ  Current Medications: Current Outpatient Medications  Medication Sig Dispense Refill  . buPROPion (WELLBUTRIN XL) 300 MG 24 hr tablet Take 1 tablet (300 mg total) by mouth daily. 90 tablet 0  . hydrOXYzine (ATARAX/VISTARIL) 10 MG tablet TAKE 1-2 TABLETS BY MOUTH 3 TIMES DAILY AS NEEDED FOR ANXIETY (ONLY FOR SEVERE ANXIETY). 180 tablet 0  . lamoTRIgine (LAMICTAL) 25 MG tablet Take 1 tablet (25 mg total) by mouth daily. 30 tablet 1  . levonorgestrel (MIRENA) 20 MCG/24HR IUD 1 each by Intrauterine route once.    . meloxicam (MOBIC) 15 MG tablet Take 1 tablet (15 mg total) by mouth daily. 30 tablet 2  . risperiDONE (RISPERDAL) 1 MG tablet Take 1  tablet (1 mg total) by mouth at bedtime. 90 tablet 0  . rOPINIRole (REQUIP) 0.25 MG tablet Take 1 tablet (0.25 mg total) by mouth at bedtime. 30 tablet 1  . sulfamethoxazole-trimethoprim (BACTRIM DS) 800-160 MG tablet Take 1 tablet by mouth 2 (two) times daily for 10 days. 20 tablet 0   No current facility-administered medications for this visit.      Musculoskeletal: Strength &  Muscle Tone: UTA Gait & Station: UTA Patient leans: N/A  Psychiatric Specialty Exam: Review of Systems  Psychiatric/Behavioral: The patient is nervous/anxious.   All other systems reviewed and are negative.   There were no vitals taken for this visit.There is no height or weight on file to calculate BMI.  General Appearance: Casual  Eye Contact:  Fair  Speech:  Clear and Coherent  Volume:  Normal  Mood:  Anxious  Affect:  Congruent  Thought Process:  Goal Directed and Descriptions of Associations: Intact  Orientation:  Full (Time, Place, and Person)  Thought Content: Logical   Suicidal Thoughts:  No  Homicidal Thoughts:  No  Memory:  Immediate;   Fair Recent;   Fair Remote;   Fair  Judgement:  Fair  Insight:  Fair  Psychomotor Activity:  Normal  Concentration:  Concentration: Fair and Attention Span: Fair  Recall:  FiservFair  Fund of Knowledge: Fair  Language: Fair  Akathisia:  No  Handed:  Right  AIMS (if indicated): denies tremors, rigidity,stiffness  Assets:  Engineer, maintenanceCommunication Skills Housing Social Support  ADL's:  Intact  Cognition: WNL  Sleep:  Fair   Screenings: PHQ2-9     [facility] Visit from 08/[facility]/2017 in [facility]  PHQ-2 Total Score  6  PHQ-9 Total Score  21       Assessment and Plan: Merry ProudBrandi is a 28 year old Caucasian female who has a history of bipolar disorder type II, anxiety, insomnia was evaluated by telemedicine today.  Patient continues to struggle with mood symptoms as well as fatigue.  She will benefit from continued medication management.  Plan Bipolar  disorder type II-unstable Reduce risperidone to 1 mg p.o. nightly due to tiredness during the day. Continue Wellbutrin as prescribed. Add Lamictal 25 mg p.o. daily Discontinue Zoloft.  For GAD-some progress Continue hydroxyzine as prescribed. Patient to continue CBT.  For panic attacks-improving Hydroxyzine as prescribed.  For insomnia- some progress Continue Requip 0.25 mg p.o. nightly Melatonin as needed.   It is likely that her fatigue and tiredness during the day could also be due to polypharmacy.  Patient however has had fatigue for a very long time even before medications were started.  Will make the following medication changes and monitor her improvement.  Before the session could end, patient's call got disconnected.  Writer attempted to call the patient multiple times after that and also tried to call the other phone number listed and her husband picked up the phone.  Left a message on the patient's phone repeating what we discussed about medication changes.  Also left a message for patient to call writer back if she has any concerns.  Also discussed her follow-up appointment with patient.  Left message on patient's phone that her follow-up appointment is on June 1 at 4 PM.  Advised patient to call us with any concerns.  I have spent atleast 15 minutes non face to face with patient today. More than 50 % of the time was spent for psychoeducation and supportive psychotherapy and care coordination.  This note was generated in part or whole with voice recognition software. Voice recognition is usually quite accurate but there are transcription errors that can and very often do occur. I apologize for any typographical errors that were not detected and corrected.         Jomarie LongsSaramma Ireland Virrueta, MD 09/14/2018, 8:57 AM

## 2018-09-14 ENCOUNTER — Encounter: Payer: Self-pay | Admitting: Psychiatry

## 2018-09-25 NOTE — Progress Notes (Signed)
   THERAPIST PROGRESS NOTE  Session Time: 30 min  Participation Level: Active  Behavioral Response: CasualAlertAnxious  Type of Therapy: Individual Therapy  Treatment Goals addressed: Coping  Interventions: CBT and Motivational Interviewing  Summary: Suzanne Boyd is a 28 y.o. female who presents with continued symptoms of her diagnosis. Assisted Patient with processing her thoughts and mood.  Allowed Patient time to become emotional about her mood/emotions.  Therapist supportive and discussed appropriate coping strategies.  Suicidal/Homicidal: No  Plan: Return again in 2 weeks.  Diagnosis: Axis I: Bipolar, mixed and Generalized Anxiety Disorder    Axis II: No diagnosis    Marinda Elk, LCSW 07/10/2018

## 2018-10-05 ENCOUNTER — Other Ambulatory Visit: Payer: Self-pay | Admitting: Psychiatry

## 2018-10-05 DIAGNOSIS — F3181 Bipolar II disorder: Secondary | ICD-10-CM

## 2018-11-30 ENCOUNTER — Encounter: Payer: Self-pay | Admitting: Family Medicine

## 2018-11-30 ENCOUNTER — Ambulatory Visit (INDEPENDENT_AMBULATORY_CARE_PROVIDER_SITE_OTHER): Payer: 59 | Admitting: Family Medicine

## 2018-11-30 ENCOUNTER — Other Ambulatory Visit: Payer: Self-pay

## 2018-11-30 VITALS — BP 105/69 | HR 57 | Temp 97.9°F | Ht 63.0 in | Wt 188.0 lb

## 2018-11-30 DIAGNOSIS — E669 Obesity, unspecified: Secondary | ICD-10-CM | POA: Diagnosis not present

## 2018-11-30 DIAGNOSIS — L299 Pruritus, unspecified: Secondary | ICD-10-CM | POA: Diagnosis not present

## 2018-11-30 DIAGNOSIS — F419 Anxiety disorder, unspecified: Secondary | ICD-10-CM

## 2018-11-30 DIAGNOSIS — Z0000 Encounter for general adult medical examination without abnormal findings: Secondary | ICD-10-CM

## 2018-11-30 DIAGNOSIS — F32A Depression, unspecified: Secondary | ICD-10-CM

## 2018-11-30 DIAGNOSIS — B3731 Acute candidiasis of vulva and vagina: Secondary | ICD-10-CM

## 2018-11-30 DIAGNOSIS — Z6833 Body mass index (BMI) 33.0-33.9, adult: Secondary | ICD-10-CM

## 2018-11-30 DIAGNOSIS — B373 Candidiasis of vulva and vagina: Secondary | ICD-10-CM | POA: Insufficient documentation

## 2018-11-30 DIAGNOSIS — F329 Major depressive disorder, single episode, unspecified: Secondary | ICD-10-CM

## 2018-11-30 MED ORDER — FLUCONAZOLE 150 MG PO TABS: 150.0000 mg | ORAL_TABLET | Freq: Once | ORAL | 0 refills | Status: AC

## 2018-11-30 NOTE — Assessment & Plan Note (Signed)
Discussed importance of healthy weight management Discussed diet and exercise  

## 2018-11-30 NOTE — Patient Instructions (Signed)
Preventive Care 21-29 Years Old, Female Preventive care refers to visits with your health care provider and lifestyle choices that can promote health and wellness. This includes:  A yearly physical exam. This may also be called an annual well check.  Regular dental visits and eye exams.  Immunizations.  Screening for certain conditions.  Healthy lifestyle choices, such as eating a healthy diet, getting regular exercise, not using drugs or products that contain nicotine and tobacco, and limiting alcohol use. What can I expect for my preventive care visit? Physical exam Your health care provider will check your:  Height and weight. This may be used to calculate body mass index (BMI), which tells if you are at a healthy weight.  Heart rate and blood pressure.  Skin for abnormal spots. Counseling Your health care provider may ask you questions about your:  Alcohol, tobacco, and drug use.  Emotional well-being.  Home and relationship well-being.  Sexual activity.  Eating habits.  Work and work environment.  Method of birth control.  Menstrual cycle.  Pregnancy history. What immunizations do I need?  Influenza (flu) vaccine  This is recommended every year. Tetanus, diphtheria, and pertussis (Tdap) vaccine  You may need a Td booster every 10 years. Varicella (chickenpox) vaccine  You may need this if you have not been vaccinated. Human papillomavirus (HPV) vaccine  If recommended by your health care provider, you may need three doses over 6 months. Measles, mumps, and rubella (MMR) vaccine  You may need at least one dose of MMR. You may also need a second dose. Meningococcal conjugate (MenACWY) vaccine  One dose is recommended if you are age 19-21 years and a first-year college student living in a residence hall, or if you have one of several medical conditions. You may also need additional booster doses. Pneumococcal conjugate (PCV13) vaccine  You may need  this if you have certain conditions and were not previously vaccinated. Pneumococcal polysaccharide (PPSV23) vaccine  You may need one or two doses if you smoke cigarettes or if you have certain conditions. Hepatitis A vaccine  You may need this if you have certain conditions or if you travel or work in places where you may be exposed to hepatitis A. Hepatitis B vaccine  You may need this if you have certain conditions or if you travel or work in places where you may be exposed to hepatitis B. Haemophilus influenzae type b (Hib) vaccine  You may need this if you have certain conditions. You may receive vaccines as individual doses or as more than one vaccine together in one shot (combination vaccines). Talk with your health care provider about the risks and benefits of combination vaccines. What tests do I need?  Blood tests  Lipid and cholesterol levels. These may be checked every 5 years starting at age 20.  Hepatitis C test.  Hepatitis B test. Screening  Diabetes screening. This is done by checking your blood sugar (glucose) after you have not eaten for a while (fasting).  Sexually transmitted disease (STD) testing.  BRCA-related cancer screening. This may be done if you have a family history of breast, ovarian, tubal, or peritoneal cancers.  Pelvic exam and Pap test. This may be done every 3 years starting at age 21. Starting at age 30, this may be done every 5 years if you have a Pap test in combination with an HPV test. Talk with your health care provider about your test results, treatment options, and if necessary, the need for more tests.   Follow these instructions at home: Eating and drinking   Eat a diet that includes fresh fruits and vegetables, whole grains, lean protein, and low-fat dairy.  Take vitamin and mineral supplements as recommended by your health care provider.  Do not drink alcohol if: ? Your health care provider tells you not to drink. ? You are  pregnant, may be pregnant, or are planning to become pregnant.  If you drink alcohol: ? Limit how much you have to 0-1 drink a day. ? Be aware of how much alcohol is in your drink. In the [place] one drink equals one 12 oz bottle of beer (355 mL), one 5 oz glass of wine (148 mL), or one 1 oz glass of hard liquor (44 mL). Lifestyle  Take daily care of your teeth and gums.  Stay active. Exercise for at least 30 minutes on 5 or more days each week.  Do not use any products that contain nicotine or tobacco, such as cigarettes, e-cigarettes, and chewing tobacco. If you need help quitting, ask your health care provider.  If you are sexually active, practice safe sex. Use a condom or other form of birth control (contraception) in order to prevent pregnancy and STIs (sexually transmitted infections). If you plan to become pregnant, see your health care provider for a preconception visit. What's next?  Visit your health care provider once a year for a well check visit.  Ask your health care provider how often you should have your eyes and teeth checked.  Stay up to date on all vaccines. This information is not intended to replace advice given to you by your health care provider. Make sure you discuss any questions you have with your health care provider. Document Released: 06/29/2001 Document Revised: 01/12/2018 Document Reviewed: 01/12/2018 Elsevier Patient Education  2020 [employer]  

## 2018-11-30 NOTE — Assessment & Plan Note (Signed)
Symptoms consistent with probable vaginal candidiasis Patient declines pelvic exam today We will treat empirically with fluconazole Discussed that if symptoms return or do not improve, she should be seen for pelvic exam and more testing

## 2018-11-30 NOTE — Progress Notes (Signed)
Patient: Suzanne Boyd, Female    DOB: 06-Aug-1990, 28 y.o.   MRN: [id number] Visit Date: 11/30/2018  Today's Provider: Shirlee LatchAngela Xiara Knisley, MD   Chief Complaint  Patient presents with  . Annual Exam  . Ear Fullness   Subjective:    Annual physical exam Suzanne LennertBrandi Tyree is a 28 y.o. female who presents today for health maintenance and complete physical. She feels fairly well. Pt is concerned she has a yeast infection also complaining of bilateral ear "Fullness" and Itching.  She reports mild discomfort.  Pt denies URI symptoms such has coughing shortness of breath, sinus pain pressure.   She reports exercising intermittently. She reports she is sleeping well.  ----------------------------------------------------------------- Last Pap: 07/04/2017 - NIL   Patient is concerned that she may have a yeast infection.  She has had vaginal yeast infections previously and this feels similar.  She describes white, chunky discharge present for 1 to 2 weeks with some burning of the skin with urination, itching, dyspareunia.  She denies any urinary frequency, urgency, fever, abdominal pain, exposure to STDs     Review of Systems  Constitutional: Negative.   HENT: Positive for ear pain. Negative for congestion, dental problem, drooling, ear discharge, facial swelling, hearing loss, mouth sores, nosebleeds, postnasal drip, rhinorrhea, sinus pressure, sinus pain, sneezing, sore throat, tinnitus, trouble swallowing and voice change.   Eyes: Negative.   Respiratory: Negative.   Cardiovascular: Negative.   Gastrointestinal: Negative.   Endocrine: Negative.   Genitourinary: Positive for vaginal discharge. Negative for decreased urine volume, difficulty urinating, dyspareunia, dysuria, enuresis, flank pain, frequency, genital sores, hematuria, menstrual problem, pelvic pain, urgency, vaginal bleeding and vaginal pain.  Musculoskeletal: Negative.   Skin: Negative.   Allergic/Immunologic: Negative.    Neurological: Negative.   Hematological: Negative.   Psychiatric/Behavioral: Negative.     Social History She  reports that she has never smoked. She has never used smokeless tobacco. She reports current alcohol use. She reports current drug use. Drug: Marijuana. Social History   Socioeconomic History  . Marital status: Legally Separated    Spouse name: Weston Brassick  . Number of children: 0  . Years of education: 8012  . Highest education level: High school graduate  Occupational History  . 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 Financial resource strain: Not hard at all  . Food insecurity    Worry: Never true    Inability: Never true  . Transportation needs    Medical: No    Non-medical: No  Tobacco Use  . Smoking status: Never Smoker  . Smokeless tobacco: Never Used  Substance and Sexual Activity  . Alcohol use: Yes    Alcohol/week: 0.0 standard drinks    Comment: occasional- 1 drink per month  . Drug use: Yes    Types: Marijuana    Comment: last used 3 weeks ago  . Sexual activity: Yes    Partners: Male    Birth control/protection: I.U.D.  Lifestyle  . Physical activity    Days per week: 0 days    Minutes per session: 0 min  . Stress: To some extent  Relationships  . Social Musicianconnections    Talks on phone: Once a week    Gets together: Once a week    Attends religious service: Never    Active member of club or organization: No    Attends meetings of clubs or organizations: Never    Relationship status: Married  Other  Topics Concern  . Not on file  Social History Narrative  . Not on file    Patient Active Problem List   Diagnosis Date Noted  . Family history of diabetes mellitus 08/29/2017  . Anxiety and depression 08/29/2017  . BMI 30.0-30.9,adult 07/04/2017    Past Surgical History:  Procedure Laterality Date  . INTRAUTERINE DEVICE (IUD) INSERTION      Family History  Family Status  Relation Name Status  . MGM  (Not  Specified)  . Mat Aunt MGA (Not Specified)  . Mat Aunt MGA (Not Specified)  . Mat Aunt MGA (Not Specified)  . Mat Aunt MGA (Not Specified)  . Mat Aunt MGA (Not Specified)  . Mat Aunt MGA (Not Specified)  . Mat Aunt MGA (Not Specified)  . Mat Uncle  (Not Specified)  . Mother  Alive  . Father  Alive  . MGF  (Not Specified)  . Brother  Alive  . Neg Hx  (Not Specified)   Her family history includes Anxiety disorder in her maternal aunt; Cancer in her maternal aunt, maternal aunt, maternal aunt, maternal aunt, maternal aunt, maternal aunt, maternal aunt, and maternal grandmother; Cancer - Colon in her maternal uncle; Depression in her maternal grandmother; Hyperlipidemia in her mother; Hypertension in her father and mother; Thyroid disease in her father and maternal grandfather; Varicose Veins in her mother.     No Known Allergies  Previous Medications   BUPROPION (WELLBUTRIN XL) 300 MG 24 HR TABLET    Take 1 tablet (300 mg total) by mouth daily.   HYDROXYZINE (ATARAX/VISTARIL) 10 MG TABLET    TAKE 1-2 TABLETS BY MOUTH 3 TIMES DAILY AS NEEDED FOR ANXIETY (ONLY FOR SEVERE ANXIETY).   LAMOTRIGINE (LAMICTAL) 25 MG TABLET    Take 1 tablet (25 mg total) by mouth daily.   LEVONORGESTREL (MIRENA) 20 MCG/24HR IUD    1 each by Intrauterine route once.   MELOXICAM (MOBIC) 15 MG TABLET    Take 1 tablet (15 mg total) by mouth daily.   RISPERIDONE (RISPERDAL) 1 MG TABLET    Take 1 tablet (1 mg total) by mouth at bedtime.   ROPINIROLE (REQUIP) 0.25 MG TABLET    Take 1 tablet (0.25 mg total) by mouth at bedtime.    Patient Care Team: Erasmo DownerBacigalupo, Joscelin Fray M, MD as PCP - General (Family Medicine)      Objective:   Vitals: BP 105/69 (BP Location: Left Arm, Patient Position: Sitting, Cuff Size: Normal)   Pulse (!) 57   Temp 97.9 F (36.6 C) (Oral)   Ht 5\' 3"  (1.6 m)   Wt 188 lb (85.3 kg)   BMI 33.30 kg/m    Physical Exam Vitals signs reviewed.  Constitutional:      General: She is not in acute  distress.    Appearance: Normal appearance. She is well-developed. She is not diaphoretic.  HENT:     Head: Normocephalic and atraumatic.     Right Ear: Tympanic membrane, ear canal and external ear normal.     Left Ear: Tympanic membrane, ear canal and external ear normal.  Eyes:     General: No scleral icterus.    Extraocular Movements: Extraocular movements intact.     Conjunctiva/sclera: Conjunctivae normal.     Pupils: Pupils are equal, round, and reactive to light.  Neck:     Musculoskeletal: Neck supple.     Thyroid: No thyromegaly.  Cardiovascular:     Rate and Rhythm: Normal rate and regular rhythm.  Heart sounds: Normal heart sounds. No murmur.  Pulmonary:     Effort: Pulmonary effort is normal. No respiratory distress.     Breath sounds: Normal breath sounds. No wheezing or rales.  Abdominal:     General: There is no distension.     Palpations: Abdomen is soft.     Tenderness: There is no abdominal tenderness.  Genitourinary:    Comments: Patient declines pelvic exam today Musculoskeletal:        General: No deformity.     Right lower leg: No edema.     Left lower leg: No edema.  Lymphadenopathy:     Cervical: No cervical adenopathy.  Skin:    General: Skin is warm and dry.     Capillary Refill: Capillary refill takes less than 2 seconds.     Findings: No rash.  Neurological:     Mental Status: She is alert and oriented to person, place, and time. Mental status is at baseline.  Psychiatric:        Mood and Affect: Mood normal.        Behavior: Behavior normal.        Thought Content: Thought content normal.      Depression Screen PHQ 2/9 Scores 08/29/2017  PHQ - 2 Score 6  PHQ- 9 Score 21      Assessment & Plan:     Routine Health Maintenance and Physical Exam  Exercise Activities and Dietary recommendations Goals   None     Immunization History  Administered Date(s) Administered  . Tdap 02/26/2014    Health Maintenance  Topic Date  Due  . PAP-Cervical Cytology Screening  10/02/2017  . INFLUENZA VACCINE  12/16/2018  . PAP SMEAR-Modifier  07/04/2020  . TETANUS/TDAP  02/27/2024  . HIV Screening  Completed     Discussed health benefits of physical activity, and encouraged her to engage in regular exercise appropriate for her age and condition.    --------------------------------------------------------------------  Problem List Items Addressed This Visit      Genitourinary   Yeast vaginitis    Symptoms consistent with probable vaginal candidiasis Patient declines pelvic exam today We will treat empirically with fluconazole Discussed that if symptoms return or do not improve, she should be seen for pelvic exam and more testing      Relevant Medications   fluconazole (DIFLUCAN) 150 MG tablet     Other   Obesity    Discussed importance of healthy weight management Discussed diet and exercise       Anxiety and depression    Chronic Followed by psychiatry Seeing a therapist No changes to medications      Ear itching    Benign exam today Discussed that this is likely related to allergic rhinitis She can try Flonase to help with some eustachian tube dysfunction that is likely what is causing her feeling of fullness       Other Visit Diagnoses    Encounter for annual physical exam    -  Primary   Relevant Orders   CBC with Differential/Platelet   Comprehensive metabolic panel   TSH       Return in about 1 year (around 11/30/2019) for CPE.   The entirety of the information documented in the History of Present Illness, Review of Systems and Physical Exam were personally obtained by me. Portions of this information were initially documented by Ashley Royalty, CMA and reviewed by me for thoroughness and accuracy.    Briel Gallicchio, Dionne Bucy, MD MPH [facility]

## 2018-11-30 NOTE — Assessment & Plan Note (Signed)
Benign exam today Discussed that this is likely related to allergic rhinitis She can try Flonase to help with some eustachian tube dysfunction that is likely what is causing her feeling of fullness

## 2018-11-30 NOTE — Assessment & Plan Note (Signed)
Chronic Followed by psychiatry Seeing a therapist No changes to medications

## 2018-12-01 LAB — CBC WITH DIFFERENTIAL/PLATELET
Basophils Absolute: 0 10*3/uL (ref 0.0–0.2)
Basos: 1 %
EOS (ABSOLUTE): 0.1 10*3/uL (ref 0.0–0.4)
Eos: 1 %
Hematocrit: 34.1 % (ref 34.0–46.6)
Hemoglobin: 11.4 g/dL (ref 11.1–15.9)
Immature Grans (Abs): 0 10*3/uL (ref 0.0–0.1)
Immature Granulocytes: 0 %
Lymphocytes Absolute: 2.4 10*3/uL (ref 0.7–3.1)
Lymphs: 47 %
MCH: 27.2 pg (ref 26.6–33.0)
MCHC: 33.4 g/dL (ref 31.5–35.7)
MCV: 81 fL (ref 79–97)
Monocytes Absolute: 0.5 10*3/uL (ref 0.1–0.9)
Monocytes: 10 %
Neutrophils Absolute: 2.1 10*3/uL (ref 1.4–7.0)
Neutrophils: 41 %
Platelets: 314 10*3/uL (ref 150–450)
RBC: 4.19 x10E6/uL (ref 3.77–5.28)
RDW: 12.9 % (ref 11.7–15.4)
WBC: 5.1 10*3/uL (ref 3.4–10.8)

## 2018-12-01 LAB — COMPREHENSIVE METABOLIC PANEL
ALT: 12 IU/L (ref 0–32)
AST: 16 IU/L (ref 0–40)
Albumin/Globulin Ratio: 1.9 (ref 1.2–2.2)
Albumin: 4.5 g/dL (ref 3.9–5.0)
Alkaline Phosphatase: 97 IU/L (ref 39–117)
BUN/Creatinine Ratio: 11 (ref 9–23)
BUN: 8 mg/dL (ref 6–20)
Bilirubin Total: 0.3 mg/dL (ref 0.0–1.2)
CO2: 22 mmol/L (ref 20–29)
Calcium: 9 mg/dL (ref 8.7–10.2)
Chloride: 106 mmol/L (ref 96–106)
Creatinine, Ser: 0.73 mg/dL (ref 0.57–1.00)
GFR calc Af Amer: 130 mL/min/{1.73_m2} (ref >59)
GFR calc non Af Amer: 112 mL/min/{1.73_m2} (ref >59)
Globulin, Total: 2.4 g/dL (ref 1.5–4.5)
Glucose: 95 mg/dL (ref 65–99)
Potassium: 4 mmol/L (ref 3.5–5.2)
Sodium: 142 mmol/L (ref 134–144)
Total Protein: 6.9 g/dL (ref 6.0–8.5)

## 2018-12-01 LAB — TSH: TSH: 1.1 u[IU]/mL (ref 0.450–4.500)

## 2019-02-19 ENCOUNTER — Other Ambulatory Visit: Payer: Self-pay

## 2019-02-19 DIAGNOSIS — Z20822 Contact with and (suspected) exposure to covid-19: Secondary | ICD-10-CM

## 2019-02-21 LAB — NOVEL CORONAVIRUS, NAA: SARS-CoV-2, NAA: NOT DETECTED

## 2019-10-16 DIAGNOSIS — Z1371 Encounter for nonprocreative screening for genetic disease carrier status: Secondary | ICD-10-CM

## 2019-10-16 DIAGNOSIS — Z9189 Other specified personal risk factors, not elsewhere classified: Secondary | ICD-10-CM

## 2019-10-16 HISTORY — DX: Encounter for nonprocreative screening for genetic disease carrier status: Z13.71

## 2019-10-16 HISTORY — DX: Other specified personal risk factors, not elsewhere classified: Z91.89

## 2019-10-29 NOTE — Progress Notes (Addendum)
Suzanne Crews, MD   Chief Complaint  Patient presents with  . IUD check    pelvic pain for the past year    HPI:      Suzanne Boyd is a 29 y.o. G0P0000 whose LMP was No LMP recorded. (Menstrual status: IUD)., presents today for pelvic pain for past yr. Pain is sharp and crampy, intermittent, occurring every 1-2 wks. Can last a few hrs to a few days, somewhat improved with NSAIDs. Doesn't affect sleep. No aggrav factors. No GI sx, has BM daily, no urin or vag sx.   She is sex active, no bleeding or pain during sex, but has crampy pain the next day. Kyleena placed 07/28/17. Pt is amenorrheic.   Pt has strong FH breast cancer in several mat grt aunts, genetic testing not done.  Last pap 07/04/17 was neg cells/neg HPV DNA. Hx of LGSIL, cannot rule out HGSIL 2016.  Past Medical History:  Diagnosis Date  . Anxiety   . Depression   . Family history of breast cancer   . LGSIL on Pap smear of cervix     Past Surgical History:  Procedure Laterality Date  . INTRAUTERINE DEVICE (IUD) INSERTION      Family History  Problem Relation Age of Onset  . Cancer Maternal Grandmother 27       Breast  . Depression Maternal Grandmother   . Brain cancer Maternal Grandmother   . Cancer Other 55       breast  . Anxiety disorder Other   . Cancer Other 55       breast  . Cancer Other 55       breast  . Cancer Other 55       breast  . Cancer Other 60       breast  . Cancer Other 60       breast  . Cancer Other 60       breast  . Cancer - Colon Maternal Uncle   . Hyperlipidemia Mother   . Hypertension Mother   . Varicose Veins Mother   . Hypertension Father   . Thyroid disease Father   . Thyroid disease Maternal Grandfather   . Ovarian cancer Neg Hx     Social History   Socioeconomic History  . Marital status: Legally Separated    Spouse name: Merrilee Seashore  . Number of children: 0  . Years of education: 43  . Highest education level: High school graduate  Occupational  History  . Occupation: Biochemist, clinical    Comment: full time  Tobacco Use  . Smoking status: Never Smoker  . Smokeless tobacco: Never Used  Vaping Use  . Vaping Use: Never used  Substance and Sexual Activity  . Alcohol use: Yes    Alcohol/week: 0.0 standard drinks    Comment: occasional- 1 drink per month  . Drug use: Yes    Types: Marijuana    Comment: last used 3 weeks ago  . Sexual activity: Yes    Partners: Male    Birth control/protection: I.U.D.    Comment: Mirena  Other Topics Concern  . Not on file  Social History Narrative  . Not on file   Social Determinants of Health   Financial Resource Strain:   . Difficulty of Paying Living Expenses:   Food Insecurity:   . Worried About Charity fundraiser in the Last Year:   . Arboriculturist in the Last Year:   [employer]  Needs:   . Lack of Transportation (Medical):   Marland Kitchen Lack of Transportation (Non-Medical):   Physical Activity:   . Days of Exercise per Week:   . Minutes of Exercise per Session:   Stress:   . Feeling of Stress :   Social Connections:   . Frequency of Communication with Friends and Family:   . Frequency of Social Gatherings with Friends and Family:   . Attends Religious Services:   . Active Member of Clubs or Organizations:   . Attends Archivist Meetings:   Marland Kitchen Marital Status:   Intimate Partner Violence:   . Fear of Current or Ex-Partner:   . Emotionally Abused:   Marland Kitchen Physically Abused:   . Sexually Abused:     Outpatient Medications Prior to Visit  Medication Sig Dispense Refill  . levonorgestrel (MIRENA) 20 MCG/24HR IUD 1 each by Intrauterine route once.    Marland Kitchen buPROPion (WELLBUTRIN XL) 300 MG 24 hr tablet Take 1 tablet (300 mg total) by mouth daily. (Patient not taking: Reported on 10/30/2019) 90 tablet 0  . hydrOXYzine (ATARAX/VISTARIL) 10 MG tablet TAKE 1-2 TABLETS BY MOUTH 3 TIMES DAILY AS NEEDED FOR ANXIETY (ONLY FOR SEVERE ANXIETY). (Patient not taking: Reported on 10/30/2019) 180  tablet 0  . lamoTRIgine (LAMICTAL) 25 MG tablet Take 1 tablet (25 mg total) by mouth daily. (Patient not taking: Reported on 10/30/2019) 30 tablet 1  . meloxicam (MOBIC) 15 MG tablet Take 1 tablet (15 mg total) by mouth daily. (Patient not taking: Reported on 10/30/2019) 30 tablet 2  . risperiDONE (RISPERDAL) 1 MG tablet Take 1 tablet (1 mg total) by mouth at bedtime. (Patient not taking: Reported on 11/30/2018) 90 tablet 0  . rOPINIRole (REQUIP) 0.25 MG tablet Take 1 tablet (0.25 mg total) by mouth at bedtime. (Patient not taking: Reported on 11/30/2018) 30 tablet 1   No facility-administered medications prior to visit.      ROS:  Review of Systems  Constitutional: Negative for fever.  Gastrointestinal: Negative for blood in stool, constipation, diarrhea, nausea and vomiting.  Genitourinary: Positive for dyspareunia. Negative for dysuria, flank pain, frequency, hematuria, urgency, vaginal bleeding, vaginal discharge and vaginal pain.  Musculoskeletal: Negative for back pain.  Skin: Negative for rash.   BREAST: No symptoms   OBJECTIVE:   Vitals:  BP 100/70   Ht '5\' 3"'  (1.6 m)   Wt 174 lb (78.9 kg)   BMI 30.82 kg/m   Physical Exam Vitals reviewed.  Constitutional:      Appearance: She is well-developed.  Pulmonary:     Effort: Pulmonary effort is normal.  Abdominal:     Palpations: Abdomen is soft.     Tenderness: There is abdominal tenderness in the right lower quadrant, suprapubic area and left lower quadrant. There is no guarding or rebound.  Genitourinary:    General: Normal vulva.     Pubic Area: No rash.      Labia:        Right: No rash, tenderness or lesion.        Left: No rash, tenderness or lesion.      Vagina: Normal. No vaginal discharge, erythema or tenderness.     Cervix: Normal.     Uterus: Normal. Not enlarged and not tender.      Adnexa: Right adnexa normal and left adnexa normal.       Right: No mass or tenderness.         Left: No mass or tenderness.  Comments: IUD STRINGS IN CX OS Musculoskeletal:        General: Normal range of motion.     Cervical back: Normal range of motion.  Skin:    General: Skin is warm and dry.  Neurological:     General: No focal deficit present.     Mental Status: She is alert and oriented to person, place, and time.  Psychiatric:        Mood and Affect: Mood normal.        Behavior: Behavior normal.        Thought Content: Thought content normal.        Judgment: Judgment normal.     Assessment/Plan: Pelvic pain - Plan: US PELVIS TRANSVAGINAL NON-OB (TV ONLY); Tender on abd exam, IUD strings in cx os. Check STDs, GYN u/s. Will f/u with results. If neg, can remove IUD vs follow expectantly.  Dyspareunia in female - Plan: US PELVIS TRANSVAGINAL NON-OB (TV ONLY)  Encounter for routine checking of intrauterine contraceptive device (IUD) - Plan: US PELVIS TRANSVAGINAL NON-OB (TV ONLY)  Family history of breast cancer - Plan: Integrated [facility] ([facility])--MyRisk testing discussed and done today. Will call with results.   Cervical cancer screening - Plan: Cytology - PAP  Screening for HPV (human papillomavirus) - Plan: Cytology - PAP  LGSIL on Pap smear of cervix - Plan: Cytology - PAP; repeat pap today    Return in about 1 day (around 10/31/2019) for GYN u/s for pelvic pain/IUD placement--[facility] to call pt.  Lysander Calixte B. Tirsa Gail, PA-C 10/30/2019 3:33 PM

## 2019-10-30 ENCOUNTER — Encounter: Payer: Self-pay | Admitting: Obstetrics and Gynecology

## 2019-10-30 ENCOUNTER — Other Ambulatory Visit: Payer: Self-pay

## 2019-10-30 ENCOUNTER — Ambulatory Visit (INDEPENDENT_AMBULATORY_CARE_PROVIDER_SITE_OTHER): Payer: 59 | Admitting: Obstetrics and Gynecology

## 2019-10-30 ENCOUNTER — Other Ambulatory Visit (HOSPITAL_COMMUNITY)
Admission: RE | Admit: 2019-10-30 | Discharge: 2019-10-30 | Disposition: A | Discharge status: Home / Self Care | Payer: 59 | Source: Ambulatory Visit | Attending: Obstetrics and Gynecology | Admitting: Obstetrics and Gynecology

## 2019-10-30 VITALS — BP 100/70 | Ht 63.0 in | Wt 174.0 lb

## 2019-10-30 DIAGNOSIS — Z803 Family history of malignant neoplasm of breast: Secondary | ICD-10-CM | POA: Insufficient documentation

## 2019-10-30 DIAGNOSIS — N941 Unspecified dyspareunia: Secondary | ICD-10-CM | POA: Diagnosis not present

## 2019-10-30 DIAGNOSIS — R87612 Low grade squamous intraepithelial lesion on cytologic smear of cervix (LGSIL): Secondary | ICD-10-CM | POA: Insufficient documentation

## 2019-10-30 DIAGNOSIS — Z1151 Encounter for screening for human papillomavirus (HPV): Secondary | ICD-10-CM | POA: Insufficient documentation

## 2019-10-30 DIAGNOSIS — Z124 Encounter for screening for malignant neoplasm of cervix: Secondary | ICD-10-CM

## 2019-10-30 DIAGNOSIS — Z30431 Encounter for routine checking of intrauterine contraceptive device: Secondary | ICD-10-CM | POA: Diagnosis not present

## 2019-10-30 DIAGNOSIS — R102 Pelvic and perineal pain: Secondary | ICD-10-CM

## 2019-10-30 DIAGNOSIS — Z113 Encounter for screening for infections with a predominantly sexual mode of transmission: Secondary | ICD-10-CM

## 2019-10-30 NOTE — Patient Instructions (Signed)
I value your feedback and entrusting us with your care. If you get a [facility] patient survey, I would appreciate you taking the time to let us know about your experience today. Thank you!  As of April 26, 2019, your lab results will be released to your MyChart immediately, before I even have a chance to see them. Please give me time to review them and contact you if there are any abnormalities. Thank you for your patience.  

## 2019-11-02 LAB — CYTOLOGY - PAP
Chlamydia: NEGATIVE
Comment: NEGATIVE
Comment: NEGATIVE
Comment: NORMAL
Diagnosis: NEGATIVE
High risk HPV: NEGATIVE
Neisseria Gonorrhea: NEGATIVE

## 2019-11-05 ENCOUNTER — Ambulatory Visit (INDEPENDENT_AMBULATORY_CARE_PROVIDER_SITE_OTHER): Payer: 59

## 2019-11-05 ENCOUNTER — Other Ambulatory Visit: Payer: Self-pay

## 2019-11-05 DIAGNOSIS — N941 Unspecified dyspareunia: Secondary | ICD-10-CM | POA: Diagnosis not present

## 2019-11-05 DIAGNOSIS — Z30431 Encounter for routine checking of intrauterine contraceptive device: Secondary | ICD-10-CM

## 2019-11-05 DIAGNOSIS — R102 Pelvic and perineal pain: Secondary | ICD-10-CM | POA: Diagnosis not present

## 2019-11-20 ENCOUNTER — Encounter: Payer: Self-pay | Admitting: Obstetrics and Gynecology

## 2019-12-04 ENCOUNTER — Encounter: Payer: 59 | Admitting: Family Medicine

## 2020-01-02 ENCOUNTER — Encounter: Payer: Self-pay | Admitting: Obstetrics and Gynecology

## 2020-01-02 ENCOUNTER — Telehealth: Payer: Self-pay | Admitting: Obstetrics and Gynecology

## 2020-01-02 DIAGNOSIS — Z803 Family history of malignant neoplasm of breast: Secondary | ICD-10-CM

## 2020-01-02 DIAGNOSIS — Z9189 Other specified personal risk factors, not elsewhere classified: Secondary | ICD-10-CM

## 2020-01-02 DIAGNOSIS — Z1231 Encounter for screening mammogram for malignant neoplasm of breast: Secondary | ICD-10-CM

## 2020-01-02 NOTE — Telephone Encounter (Signed)
UTR pt 3 times in July re: MyRisk results. Letter and results mailed to pt.   Dear Suzanne Boyd,  I have tried to reach you several times regarding your cancer genetic testing results from June 2021. Your tests show you are negative for 35 genes related to breast, ovarian and colon cancers. This doesn't mean you will never get any of these cancers, but you don't have a genetic mutation that significantly increases your risk of these cancers. With that said, you are still at increased risk of breast cancer based on your family history. Two computer models calculate your lifetime risk of breast cancer as 19.4% and 22.2%. Once a woman's lifetime risk is over 20%, then additional breast cancer screening options are recommended, such as starting mammograms at age 4, doing monthly self-breast exams, having a clinical breast exam (in the office) yearly and screening breast MRIs yearly.   Since you have not had a mammogram yet, I put an order in for you to schedule at your convenience. ([place] at [facility]: [phone]). I am also including your copy of these results for your records. You have access to a genetic counselor at [facility] (at no charge to you) if you would like to discuss this further (phone number circled on lab results) or you can call me at the office.   Please let me know if you have any further questions after reviewing your results.   Sincerely,    Suzanne Perfect, Suzanne Boyd  [place], [phone]                        1     Print Letter Notes  Suzanne Boyd, [place] at 0/50/4030 9:23 AM  Status: Signed  Mammo order due to increased risk of breast cancer due to [facility]

## 2020-01-02 NOTE — Progress Notes (Signed)
Mammo order due to increased risk of breast cancer due to [facility]

## 2020-04-17 ENCOUNTER — Other Ambulatory Visit: Payer: Self-pay

## 2020-04-17 ENCOUNTER — Ambulatory Visit (INDEPENDENT_AMBULATORY_CARE_PROVIDER_SITE_OTHER): Payer: 59 | Admitting: Family Medicine

## 2020-04-17 ENCOUNTER — Encounter: Payer: Self-pay | Admitting: Family Medicine

## 2020-04-17 VITALS — BP 115/72 | HR 100 | Temp 98.3°F | Resp 16 | Ht 63.0 in | Wt 169.6 lb

## 2020-04-17 DIAGNOSIS — Z6830 Body mass index (BMI) 30.0-30.9, adult: Secondary | ICD-10-CM

## 2020-04-17 DIAGNOSIS — F3181 Bipolar II disorder: Secondary | ICD-10-CM | POA: Diagnosis not present

## 2020-04-17 DIAGNOSIS — E669 Obesity, unspecified: Secondary | ICD-10-CM

## 2020-04-17 DIAGNOSIS — F411 Generalized anxiety disorder: Secondary | ICD-10-CM | POA: Diagnosis not present

## 2020-04-17 DIAGNOSIS — Z833 Family history of diabetes mellitus: Secondary | ICD-10-CM | POA: Diagnosis not present

## 2020-04-17 DIAGNOSIS — Z0000 Encounter for general adult medical examination without abnormal findings: Secondary | ICD-10-CM | POA: Insufficient documentation

## 2020-04-17 DIAGNOSIS — G4700 Insomnia, unspecified: Secondary | ICD-10-CM | POA: Diagnosis not present

## 2020-04-17 MED ORDER — CLOTRIMAZOLE 1 % EX CREA: 1.0000 "application " | TOPICAL_CREAM | Freq: Two times a day (BID) | CUTANEOUS | 0 refills | Status: DC

## 2020-04-17 NOTE — Assessment & Plan Note (Signed)
Discussed importance of healthy weight management Discussed diet and exercise  

## 2020-04-17 NOTE — Progress Notes (Signed)
Complete physical exam   Patient: Suzanne Boyd   DOB: 1990/09/09   29 y.o. Female  MRN: [id number] Visit Date: 04/17/2020  Today's healthcare provider: Lavon Paganini, MD   Chief Complaint  Patient presents with  . Annual Exam   Subjective    Suzanne Boyd is a 29 y.o. female who presents today for a complete physical exam.  She reports consuming a general diet. The patient does not participate in regular exercise at present. She generally feels fairly well. She reports sleeping poorly. She does have additional problems to discuss today.  HPI   Gurneet has noticed a rash on her right breast that she would like examined today. Additionally, she notes that  It is hard to fall alseep, and she wakes up several times a night. She doesn't eat much during the day. She is anxious around other people. Feels like a chore to talk to people because she is concerned about being judged. She describes her mood as 'sluggish' and wishes she had more energy to go out and do things. She has not seen her therapist or psychiatrist since [place]. She is currently taking no medications.    Works at [employer]. Shifts 3pm-11pm     Past Medical History:  Diagnosis Date  . Anxiety   . BRCA negative 10/2019   MyRIsk neg  . Depression   . Family history of breast cancer   . Increased risk of breast cancer 10/2019   IBIS=19.4%/riskscore=22.2%  . LGSIL on Pap smear of cervix    Past Surgical History:  Procedure Laterality Date  . INTRAUTERINE DEVICE (IUD) INSERTION     Social History   Socioeconomic History  . Marital status: Legally Separated    Spouse name: Suzanne Boyd  . Number of children: 0  . Years of education: 61  . Highest education level: High school graduate  Occupational History  . Occupation: Biochemist, clinical    Comment: full time  Tobacco Use  . Smoking status: Never Smoker  . Smokeless tobacco: Never Used  Vaping Use  . Vaping Use: Never used  Substance  and Sexual Activity  . Alcohol use: Yes    Alcohol/week: 0.0 standard drinks    Comment: occasional- 1 drink per month  . Drug use: Yes    Types: Marijuana    Comment: last used 3 weeks ago  . Sexual activity: Yes    Partners: Male    Birth control/protection: I.U.D.    Comment: Mirena  Other Topics Concern  . Not on file  Social History Narrative  . Not on file   Social Determinants of Health   Financial Resource Strain:   . Difficulty of Paying Living Expenses: Not on file  Food Insecurity:   . Worried About Charity fundraiser in the Last Year: Not on file  . Ran Out of Food in the Last Year: Not on file  Transportation Needs:   . Lack of Transportation (Medical): Not on file  . Lack of Transportation (Non-Medical): Not on file  Physical Activity:   . Days of Exercise per Week: Not on file  . Minutes of Exercise per Session: Not on file  Stress:   . Feeling of Stress : Not on file  Social Connections:   . Frequency of Communication with Friends and Family: Not on file  . Frequency of Social Gatherings with Friends and Family: Not on file  . Attends Religious Services: Not on file  . Active Member of  Clubs or Organizations: Not on file  . Attends Archivist Meetings: Not on file  . Marital Status: Not on file  Intimate Partner Violence:   . Fear of Current or Ex-Partner: Not on file  . Emotionally Abused: Not on file  . Physically Abused: Not on file  . Sexually Abused: Not on file   Family Status  Relation Name Status  . MGM  Deceased  . Other MGA Deceased  . Other MGA Deceased  . Other MGA Deceased  . Other MGA Deceased  . Other MGA Deceased  . Other MGA Deceased  . Other MGA Deceased  . Mat Uncle  (Not Specified)  . Mother  Alive  . Father  Alive  . MGF  (Not Specified)  . Brother  Alive  . Neg Hx  (Not Specified)   Family History  Problem Relation Age of Onset  . Cancer Maternal Grandmother 63       Breast  . Depression Maternal  Grandmother   . Brain cancer Maternal Grandmother   . Cancer Other 55       breast  . Anxiety disorder Other   . Cancer Other 55       breast  . Cancer Other 55       breast  . Cancer Other 55       breast  . Cancer Other 60       breast  . Cancer Other 60       breast  . Cancer Other 60       breast  . Cancer - Colon Maternal Uncle   . Hyperlipidemia Mother   . Hypertension Mother   . Varicose Veins Mother   . Hypertension Father   . Thyroid disease Father   . Thyroid disease Maternal Grandfather   . Healthy Brother   . Ovarian cancer Neg Hx    No Known Allergies  Patient Care Team: Virginia Crews, MD as PCP - General (Family Medicine)   Medications: Outpatient Medications Prior to Visit  Medication Sig  . levonorgestrel (MIRENA) 20 MCG/24HR IUD 1 each by Intrauterine route once.  . [DISCONTINUED] buPROPion (WELLBUTRIN XL) 300 MG 24 hr tablet Take 1 tablet (300 mg total) by mouth daily. (Patient not taking: Reported on 10/30/2019)  . [DISCONTINUED] hydrOXYzine (ATARAX/VISTARIL) 10 MG tablet TAKE 1-2 TABLETS BY MOUTH 3 TIMES DAILY AS NEEDED FOR ANXIETY (ONLY FOR SEVERE ANXIETY). (Patient not taking: Reported on 10/30/2019)  . [DISCONTINUED] lamoTRIgine (LAMICTAL) 25 MG tablet Take 1 tablet (25 mg total) by mouth daily. (Patient not taking: Reported on 10/30/2019)  . [DISCONTINUED] meloxicam (MOBIC) 15 MG tablet Take 1 tablet (15 mg total) by mouth daily. (Patient not taking: Reported on 10/30/2019)  . [DISCONTINUED] risperiDONE (RISPERDAL) 1 MG tablet Take 1 tablet (1 mg total) by mouth at bedtime. (Patient not taking: Reported on 11/30/2018)  . [DISCONTINUED] rOPINIRole (REQUIP) 0.25 MG tablet Take 1 tablet (0.25 mg total) by mouth at bedtime. (Patient not taking: Reported on 11/30/2018)   No facility-administered medications prior to visit.    Review of Systems  Constitutional: Negative.   HENT: Negative.   Eyes: Negative.   Respiratory: Negative.   Cardiovascular:  Negative.   Gastrointestinal: Negative.   Endocrine: Negative.   Genitourinary: Negative.   Musculoskeletal: Negative.   Skin: Negative.   Allergic/Immunologic: Negative.   Neurological: Negative.   Hematological: Negative.   Psychiatric/Behavioral: Positive for dysphoric mood and sleep disturbance. The patient is nervous/anxious.  Objective    BP 115/72 (BP Location: Right Arm, Patient Position: Sitting, Cuff Size: Normal)   Pulse 100   Temp 98.3 F (36.8 C) (Oral)   Resp 16   Ht '5\' 3"'  (1.6 m)   Wt 169 lb 9.6 oz (76.9 kg)   BMI 30.04 kg/m    Physical Exam   General: Well appearing in NAD Lungs: CTA bilaterally Cards: RRR no murmurs rubs or gallops Derm: Erythematous annular raised patches on underside of R breast.  Psych  Mood: 'sluggish'  Affect: Euthymic, mood congruent, brightens on approach  Conversation: responsive, clear coherent  Thought process; Logical, linear  Thought content: denies SI A/V/H  Insight: Impaired  Judgement: Intact  Impulse control: intact  Last depression screening scores PHQ 2/9 Scores 04/17/2020 11/30/2018 08/29/2017  PHQ - 2 Score '3 1 6  ' PHQ- 9 Score '10 7 21   ' Last fall risk screening Fall Risk  04/17/2020  Falls in the past year? 0  Number falls in past yr: 0  Injury with Fall? 0  Risk for fall due to : No Fall Risks  Follow up Falls evaluation completed   Last Audit-C alcohol use screening Alcohol Use Disorder Test (AUDIT) 04/17/2020  1. How often do you have a drink containing alcohol? 1  2. How many drinks containing alcohol do you have on a typical day when you are drinking? 1  3. How often do you have six or more drinks on one occasion? 0  AUDIT-C Score 2  Alcohol Brief Interventions/Follow-up AUDIT Score <7 follow-up not indicated   A score of 3 or more in women, and 4 or more in men indicates increased risk for alcohol abuse, EXCEPT if all of the points are from question 1   No results found for any visits on  04/17/20.  Assessment & Plan    Routine Health Maintenance and Physical Exam  Exercise Activities and Dietary recommendations Goals   None     Immunization History  Administered Date(s) Administered  . Tdap 02/26/2014    Health Maintenance  Topic Date Due  . COVID-19 Vaccine (1) Never done  . INFLUENZA VACCINE  08/14/2020 (Originally 12/16/2019)  . PAP SMEAR-Modifier  10/30/2022  . TETANUS/TDAP  02/27/2024  . Hepatitis C Screening  Completed  . HIV Screening  Completed    Discussed health benefits of physical activity, and encouraged her to engage in regular exercise appropriate for her age and condition.   Problem List Items Addressed This Visit      Other   Obesity    Discussed importance of healthy weight management Discussed diet and exercise      Relevant Orders   CBC   Comprehensive metabolic panel   Lipid panel   Hemoglobin A1c   Family history of diabetes mellitus    Screening A1c      Relevant Orders   Hemoglobin A1c   Bipolar 2 disorder ([place])    Previously seen by psychiatrist. Has not followed up in over a year. Currently not taking any medications Discussed importance of treatment Referral to new psychiatrist      Relevant Orders   Ambulatory referral to Psychiatry   GAD (generalized anxiety disorder)    Has not followed up with therapists since [place] Encouraged to reschedule with them Referral to psychiatry with comorbid Bipolar II      Relevant Orders   Ambulatory referral to Psychiatry   Insomnia    Difficulty falling asleep and staying asleep, likely related to mood  disorders      Relevant Orders   Ambulatory referral to Psychiatry   Encounter for annual physical exam - Primary    Routine Annual Labs Check CBC      Relevant Orders   CBC   Comprehensive metabolic panel   Lipid panel   Hemoglobin A1c       Return in about 1 year (around 04/17/2021) for CPE.     Patient seen along with MS[employer] student [facility]. I  personally evaluated this patient along with the student, and verified all aspects of the history, physical exam, and medical decision making as documented by the student. I agree with the student's documentation and have made all necessary edits.  Jaaron Oleson, Dionne Bucy, MD, MPH [facility]

## 2020-04-17 NOTE — Assessment & Plan Note (Signed)
Has not followed up with therapists since COVID Encouraged to reschedule with them Referral to psychiatry with comorbid Bipolar II

## 2020-04-17 NOTE — Assessment & Plan Note (Signed)
Previously seen by psychiatrist. Has not followed up in over a year. Currently not taking any medications Discussed importance of treatment Referral to new psychiatrist

## 2020-04-17 NOTE — Assessment & Plan Note (Addendum)
Routine Annual Labs Check CBC Counseled on gaps in care such as COVID and flu vaccine

## 2020-04-17 NOTE — Assessment & Plan Note (Signed)
Screening A1c

## 2020-04-17 NOTE — Patient Instructions (Signed)
Preventive Care 21-30 Years Old, Female Preventive care refers to visits with your health care provider and lifestyle choices that can promote health and wellness. This includes:  A yearly physical exam. This may also be called an annual well check.  Regular dental visits and eye exams.  Immunizations.  Screening for certain conditions.  Healthy lifestyle choices, such as eating a healthy diet, getting regular exercise, not using drugs or products that contain nicotine and tobacco, and limiting alcohol use. What can I expect for my preventive care visit? Physical exam Your health care provider will check your:  Height and weight. This may be used to calculate body mass index (BMI), which tells if you are at a healthy weight.  Heart rate and blood pressure.  Skin for abnormal spots. Counseling Your health care provider may ask you questions about your:  Alcohol, tobacco, and drug use.  Emotional well-being.  Home and relationship well-being.  Sexual activity.  Eating habits.  Work and work environment.  Method of birth control.  Menstrual cycle.  Pregnancy history. What immunizations do I need?  Influenza (flu) vaccine  This is recommended every year. Tetanus, diphtheria, and pertussis (Tdap) vaccine  You may need a Td booster every 10 years. Varicella (chickenpox) vaccine  You may need this if you have not been vaccinated. Human papillomavirus (HPV) vaccine  If recommended by your health care provider, you may need three doses over 6 months. Measles, mumps, and rubella (MMR) vaccine  You may need at least one dose of MMR. You may also need a second dose. Meningococcal conjugate (MenACWY) vaccine  One dose is recommended if you are age 19-21 years and a first-year college student living in a residence hall, or if you have one of several medical conditions. You may also need additional booster doses. Pneumococcal conjugate (PCV13) vaccine  You may need  this if you have certain conditions and were not previously vaccinated. Pneumococcal polysaccharide (PPSV23) vaccine  You may need one or two doses if you smoke cigarettes or if you have certain conditions. Hepatitis A vaccine  You may need this if you have certain conditions or if you travel or work in places where you may be exposed to hepatitis A. Hepatitis B vaccine  You may need this if you have certain conditions or if you travel or work in places where you may be exposed to hepatitis B. Haemophilus influenzae type b (Hib) vaccine  You may need this if you have certain conditions. You may receive vaccines as individual doses or as more than one vaccine together in one shot (combination vaccines). Talk with your health care provider about the risks and benefits of combination vaccines. What tests do I need?  Blood tests  Lipid and cholesterol levels. These may be checked every 5 years starting at age 20.  Hepatitis C test.  Hepatitis B test. Screening  Diabetes screening. This is done by checking your blood sugar (glucose) after you have not eaten for a while (fasting).  Sexually transmitted disease (STD) testing.  BRCA-related cancer screening. This may be done if you have a family history of breast, ovarian, tubal, or peritoneal cancers.  Pelvic exam and Pap test. This may be done every 3 years starting at age 21. Starting at age 30, this may be done every 5 years if you have a Pap test in combination with an HPV test. Talk with your health care provider about your test results, treatment options, and if necessary, the need for more tests.   Follow these instructions at home: Eating and drinking   Eat a diet that includes fresh fruits and vegetables, whole grains, lean protein, and low-fat dairy.  Take vitamin and mineral supplements as recommended by your health care provider.  Do not drink alcohol if: ? Your health care provider tells you not to drink. ? You are  pregnant, may be pregnant, or are planning to become pregnant.  If you drink alcohol: ? Limit how much you have to 0-1 drink a day. ? Be aware of how much alcohol is in your drink. In the [place] one drink equals one 12 oz bottle of beer (355 mL), one 5 oz glass of wine (148 mL), or one 1 oz glass of hard liquor (44 mL). Lifestyle  Take daily care of your teeth and gums.  Stay active. Exercise for at least 30 minutes on 5 or more days each week.  Do not use any products that contain nicotine or tobacco, such as cigarettes, e-cigarettes, and chewing tobacco. If you need help quitting, ask your health care provider.  If you are sexually active, practice safe sex. Use a condom or other form of birth control (contraception) in order to prevent pregnancy and STIs (sexually transmitted infections). If you plan to become pregnant, see your health care provider for a preconception visit. What's next?  Visit your health care provider once a year for a well check visit.  Ask your health care provider how often you should have your eyes and teeth checked.  Stay up to date on all vaccines. This information is not intended to replace advice given to you by your health care provider. Make sure you discuss any questions you have with your health care provider. Document Revised: 01/12/2018 Document Reviewed: 01/12/2018 Elsevier Patient Education  2020 [employer].

## 2020-04-17 NOTE — Assessment & Plan Note (Signed)
Difficulty falling asleep and staying asleep, likely related to mood disorders

## 2020-12-28 ENCOUNTER — Encounter: Payer: Self-pay | Admitting: Family Medicine

## 2020-12-28 DIAGNOSIS — F411 Generalized anxiety disorder: Secondary | ICD-10-CM

## 2020-12-28 DIAGNOSIS — F3181 Bipolar II disorder: Secondary | ICD-10-CM

## 2020-12-30 NOTE — Telephone Encounter (Signed)
Please place referral to Psych

## 2020-12-30 NOTE — Telephone Encounter (Signed)
Referral placed.

## 2021-02-04 ENCOUNTER — Other Ambulatory Visit: Payer: Self-pay

## 2021-02-04 ENCOUNTER — Ambulatory Visit
Admission: EM | Admit: 2021-02-04 | Discharge: 2021-02-04 | Disposition: A | Discharge status: Home / Self Care | Payer: 59 | Attending: Family Medicine | Admitting: Family Medicine

## 2021-02-04 DIAGNOSIS — R519 Headache, unspecified: Secondary | ICD-10-CM | POA: Diagnosis not present

## 2021-02-04 DIAGNOSIS — R059 Cough, unspecified: Secondary | ICD-10-CM | POA: Diagnosis present

## 2021-02-04 DIAGNOSIS — R110 Nausea: Secondary | ICD-10-CM | POA: Diagnosis not present

## 2021-02-04 DIAGNOSIS — B9789 Other viral agents as the cause of diseases classified elsewhere: Secondary | ICD-10-CM | POA: Diagnosis not present

## 2021-02-04 DIAGNOSIS — J029 Acute pharyngitis, unspecified: Secondary | ICD-10-CM | POA: Insufficient documentation

## 2021-02-04 DIAGNOSIS — J988 Other specified respiratory disorders: Secondary | ICD-10-CM

## 2021-02-04 DIAGNOSIS — U071 COVID-19: Secondary | ICD-10-CM | POA: Insufficient documentation

## 2021-02-04 LAB — GROUP A STREP BY PCR: Group A Strep by PCR: NOT DETECTED

## 2021-02-04 MED ORDER — LIDOCAINE VISCOUS HCL 2 % MT SOLN: OROMUCOSAL | 0 refills | Status: DC

## 2021-02-04 NOTE — Discharge Instructions (Signed)
Strep was negative.  Awaiting COVID test results.  Medication as prescribed.  Tylenol and ibuprofen as needed.

## 2021-02-04 NOTE — ED Provider Notes (Signed)
[facility]    CSN: [id number] Arrival date & time: 02/04/21  1503  History   Chief Complaint Sore throat, cough, body aches, headache   HPI  30 year old female presents with the above complaints.  Patient states that she has had symptoms since yesterday.  She reports sore throat, headache, runny nose, cough.  Also reports nausea.  No documented fever.  She is most bothered by the sore throat.  Pain 8/10 in severity.  She states that she has had coworkers that have been out sick.  Desires COVID testing today.  Past Medical History:  Diagnosis Date   Anxiety    BRCA negative 10/2019   MyRIsk neg   Depression    Family history of breast cancer    Increased risk of breast cancer 10/2019   IBIS=19.4%/riskscore=22.2%   LGSIL on Pap smear of cervix     Patient Active Problem List   Diagnosis Date Noted   Bipolar 2 disorder ([place]) 04/17/2020   GAD (generalized anxiety disorder) 04/17/2020   Insomnia 04/17/2020   Encounter for annual physical exam 04/17/2020   Family history of breast cancer 10/30/2019   LGSIL on Pap smear of cervix 10/30/2019   Family history of diabetes mellitus 08/29/2017   Obesity 07/04/2017    Past Surgical History:  Procedure Laterality Date   INTRAUTERINE DEVICE (IUD) INSERTION      OB History     Gravida  0   Para  0   Term  0   Preterm  0   AB  0   Living  0      SAB  0   IAB  0   Ectopic  0   Multiple  0   Live Births               Home Medications    Prior to Admission medications   Medication Sig Start Date End Date Taking? Authorizing Provider  levonorgestrel (MIRENA) 20 MCG/24HR IUD 1 each by Intrauterine route once.   Yes [provider]  lidocaine (XYLOCAINE) 2 % solution Gargle 15 mL every 3 hours as needed for sore throat. May swallow if desired. 02/04/21  Yes Coral Spikes, DO    Family History Family History  Problem Relation Age of Onset   Cancer Maternal Grandmother 92        Breast   Depression Maternal Grandmother    Brain cancer Maternal Grandmother    Cancer Other 13       breast   Anxiety disorder Other    Cancer Other 55       breast   Cancer Other 55       breast   Cancer Other 55       breast   Cancer Other 23       breast   Cancer Other 86       breast   Cancer Other 37       breast   Cancer - Colon Maternal Uncle    Hyperlipidemia Mother    Hypertension Mother    Varicose Veins Mother    Hypertension Father    Thyroid disease Father    Thyroid disease Maternal Grandfather    Healthy Brother    Ovarian cancer Neg Hx     Social History Social History   Tobacco Use   Smoking status: Never   Smokeless tobacco: Never  Vaping Use   Vaping Use: Never used  Substance Use Topics   Alcohol use:  Yes    Alcohol/week: 0.0 standard drinks    Comment: occasional- 1 drink per month   Drug use: Yes    Types: Marijuana    Comment: last used 3 weeks ago     Allergies   Patient has no known allergies.   Review of Systems Review of Systems Per HPI  Physical Exam Triage Vital Signs ED Triage Vitals  Enc Vitals Group     BP 02/04/21 1528 136/65     Pulse Rate 02/04/21 1528 92     Resp 02/04/21 1528 18     Temp 02/04/21 1528 99.8 F (37.7 C)     Temp Source 02/04/21 1528 Oral     SpO2 02/04/21 1528 97 %     Weight 02/04/21 1526 168 lb (76.2 kg)     Height 02/04/21 1526 $RemoveBefor'5\' 3"'qtJfwvmKVwir$  (1.6 m)     Head Circumference --      Peak Flow --      Pain Score 02/04/21 1526 8     Pain Loc --      Pain Edu? --      Excl. in [place]? --    Updated Vital Signs BP 136/65 (BP Location: Right Arm)   Pulse 92   Temp 99.8 F (37.7 C) (Oral)   Resp 18   Ht $R'5\' 3"'BB$  (1.6 m)   Wt 76.2 kg   SpO2 97%   BMI 29.76 kg/m   Visual Acuity Right Eye Distance:   Left Eye Distance:   Bilateral Distance:    Right Eye Near:   Left Eye Near:    Bilateral Near:     Physical Exam Constitutional:      General: She is not in acute distress.    Appearance:  Normal appearance.  HENT:     Head: Normocephalic and atraumatic.     Right Ear: Tympanic membrane normal.     Left Ear: Tympanic membrane normal.     Mouth/Throat:     Pharynx: Oropharynx is clear. No oropharyngeal exudate.  Eyes:     General:        Right eye: No discharge.     Conjunctiva/sclera: Conjunctivae normal.  Cardiovascular:     Rate and Rhythm: Normal rate and regular rhythm.  Pulmonary:     Effort: Pulmonary effort is normal.     Breath sounds: Normal breath sounds. No wheezing, rhonchi or rales.  Neurological:     Mental Status: She is alert.     UC Treatments / Results  Labs (all labs ordered are listed, but only abnormal results are displayed) Labs Reviewed  GROUP A STREP BY PCR  SARS CORONAVIRUS 2 (TAT 6-24 HRS)    EKG   Radiology No results found.  Procedures Procedures (including critical care time)  Medications Ordered in UC Medications - No data to display  Initial Impression / Assessment and Plan / UC Course  I have reviewed the triage vital signs and the nursing notes.  Pertinent labs & imaging results that were available during my care of the patient were reviewed by me and considered in my medical decision making (see chart for details).    30 year old female presents with a viral respiratory infection.  Strep negative.  Awaiting COVID test results.  Viscous lidocaine for sore throat.  Supportive care.  Final Clinical Impressions(s) / UC Diagnoses   Final diagnoses:  Viral respiratory infection     Discharge Instructions      Strep was negative.  Awaiting COVID test results.  Medication as prescribed.  Tylenol and ibuprofen as needed.   ED Prescriptions     Medication Sig Dispense Auth. Provider   lidocaine (XYLOCAINE) 2 % solution Gargle 15 mL every 3 hours as needed for sore throat. May swallow if desired. 200 mL Coral Spikes, DO      PDMP not reviewed this encounter.   Coral Spikes, [place] 02/04/21 1627

## 2021-02-04 NOTE — ED Triage Notes (Signed)
Pt here with C/O Sore throat, cough, body aches, headache for 1 day, no fever.

## 2021-02-05 LAB — SARS CORONAVIRUS 2 (TAT 6-24 HRS): SARS Coronavirus 2: POSITIVE — AB

## 2021-04-21 ENCOUNTER — Other Ambulatory Visit: Payer: Self-pay

## 2021-04-21 ENCOUNTER — Ambulatory Visit (INDEPENDENT_AMBULATORY_CARE_PROVIDER_SITE_OTHER): Payer: 59 | Admitting: Family Medicine

## 2021-04-21 ENCOUNTER — Encounter: Payer: Self-pay | Admitting: Family Medicine

## 2021-04-21 VITALS — BP 104/60 | HR 64 | Temp 97.6°F | Resp 16 | Ht 63.0 in | Wt 168.0 lb

## 2021-04-21 DIAGNOSIS — R102 Pelvic and perineal pain: Secondary | ICD-10-CM | POA: Diagnosis not present

## 2021-04-21 DIAGNOSIS — Z0000 Encounter for general adult medical examination without abnormal findings: Secondary | ICD-10-CM | POA: Diagnosis not present

## 2021-04-21 DIAGNOSIS — F411 Generalized anxiety disorder: Secondary | ICD-10-CM

## 2021-04-21 DIAGNOSIS — L910 Hypertrophic scar: Secondary | ICD-10-CM | POA: Diagnosis not present

## 2021-04-21 DIAGNOSIS — R45851 Suicidal ideations: Secondary | ICD-10-CM

## 2021-04-21 DIAGNOSIS — F3181 Bipolar II disorder: Secondary | ICD-10-CM

## 2021-04-21 DIAGNOSIS — Z833 Family history of diabetes mellitus: Secondary | ICD-10-CM | POA: Diagnosis not present

## 2021-04-21 DIAGNOSIS — E663 Overweight: Secondary | ICD-10-CM | POA: Diagnosis not present

## 2021-04-21 MED ORDER — LAMOTRIGINE 25 MG PO TABS: 25.0000 mg | ORAL_TABLET | Freq: Every day | ORAL | 1 refills | Status: DC

## 2021-04-21 NOTE — Assessment & Plan Note (Signed)
As above, contracted for safety No active plan Will start lamictal and refer to psych Resources given

## 2021-04-21 NOTE — Progress Notes (Signed)
Complete physical exam   Patient: Suzanne Boyd   DOB: Jun 17, 1990   30 y.o. Female  MRN: [id number] Visit Date: 04/21/2021  Today's healthcare provider: Lavon Paganini, MD   Chief Complaint  Patient presents with   Annual Exam   Abdominal Pain    Subjective    Suzanne Boyd is a 30 y.o. female who presents today for a complete physical exam.  She reports consuming a general diet. The patient does not participate in regular exercise at present. She generally feels fairly well. She reports sleeping well. She does have additional problems to discuss today.  HPI  Pap:10/30/19  Occasional pelvic pain L or R intermittently. Happens throughout the month.  F/b GYN  Very depressed with passive SI.  Feels safe to self - good support system.  Gave up her gun to get it out of the house.  Past Medical History:  Diagnosis Date   Anxiety    BRCA negative 10/2019   MyRIsk neg   Depression    Family history of breast cancer    Increased risk of breast cancer 10/2019   IBIS=19.4%/riskscore=22.2%   LGSIL on Pap smear of cervix    Past Surgical History:  Procedure Laterality Date   INTRAUTERINE DEVICE (IUD) INSERTION     Social History   Socioeconomic History   Marital status: Legally Separated    Spouse name: Merrilee Seashore   Number of children: 0   Years of education: 12   Highest education level: High school graduate  Occupational History   Occupation: Biochemist, clinical    Comment: full time  Tobacco Use   Smoking status: Never   Smokeless tobacco: Never  Vaping Use   Vaping Use: Never used  Substance and Sexual Activity   Alcohol use: Yes    Alcohol/week: 0.0 standard drinks    Comment: occasional- 1 drink per month   Drug use: Yes    Types: Marijuana    Comment: last used 3 weeks ago   Sexual activity: Yes    Partners: Male    Birth control/protection: I.U.D.    Comment: Mirena  Other Topics Concern   Not on file  Social History Narrative   Not on file   Social  Determinants of Health   Financial Resource Strain: Not on file  Food Insecurity: Not on file  Transportation Needs: Not on file  Physical Activity: Not on file  Stress: Not on file  Social Connections: Not on file  Intimate Partner Violence: Not on file   Family Status  Relation Name Status   MGM  Deceased   Other MGA Deceased   Other MGA Deceased   Other MGA Deceased   Other MGA Deceased   Other MGA Deceased   Other MGA Deceased   Other MGA Deceased   Mat Uncle  (Not Specified)   Mother  Alive   Father  Alive   MGF  (Not Specified)   Brother  Alive   Neg Hx  (Not Specified)   Family History  Problem Relation Age of Onset   Cancer Maternal Grandmother 51       Breast   Depression Maternal Grandmother    Brain cancer Maternal Grandmother    Cancer Other 5       breast   Anxiety disorder Other    Cancer Other 55       breast   Cancer Other 55       breast   Cancer Other 24  breast   Cancer Other 60       breast   Cancer Other 49       breast   Cancer Other 14       breast   Cancer - Colon Maternal Uncle    Hyperlipidemia Mother    Hypertension Mother    Varicose Veins Mother    Hypertension Father    Thyroid disease Father    Thyroid disease Maternal Grandfather    Healthy Brother    Ovarian cancer Neg Hx    No Known Allergies  Patient Care Team: Pattie Flaharty, Dionne Bucy, MD as PCP - General (Family Medicine)   Medications: Outpatient Medications Prior to Visit  Medication Sig   levonorgestrel (MIRENA) 20 MCG/24HR IUD 1 each by Intrauterine route once.   lidocaine (XYLOCAINE) 2 % solution Gargle 15 mL every 3 hours as needed for sore throat. May swallow if desired. (Patient not taking: Reported on 04/21/2021)   No facility-administered medications prior to visit.    Review of Systems  Constitutional:  Positive for activity change.  HENT: Negative.    Eyes: Negative.   Respiratory: Negative.    Cardiovascular: Negative.   Gastrointestinal:   Positive for abdominal distention.  Endocrine: Negative.   Genitourinary:  Positive for frequency and vaginal pain.  Musculoskeletal: Negative.   Skin: Negative.   Allergic/Immunologic: Negative.   Neurological:  Positive for headaches.  Hematological: Negative.   Psychiatric/Behavioral:  Positive for agitation, behavioral problems, decreased concentration, sleep disturbance and suicidal ideas. The patient is nervous/anxious.    Last CBC Lab Results  Component Value Date   WBC 5.1 11/30/2018   HGB 11.4 11/30/2018   HCT 34.1 11/30/2018   MCV 81 11/30/2018   MCH 27.2 11/30/2018   RDW 12.9 11/30/2018   PLT 314 49/67/5916   Last metabolic panel Lab Results  Component Value Date   GLUCOSE 95 11/30/2018   NA 142 11/30/2018   K 4.0 11/30/2018   CL 106 11/30/2018   CO2 22 11/30/2018   BUN 8 11/30/2018   CREATININE 0.73 11/30/2018   GFRNONAA 112 11/30/2018   CALCIUM 9.0 11/30/2018   PROT 6.9 11/30/2018   ALBUMIN 4.5 11/30/2018   LABGLOB 2.4 11/30/2018   AGRATIO 1.9 11/30/2018   BILITOT 0.3 11/30/2018   ALKPHOS 97 11/30/2018   AST 16 11/30/2018   ALT 12 11/30/2018   Last lipids Lab Results  Component Value Date   CHOL 154 08/29/2017   HDL 51 08/29/2017   LDLCALC 86 08/29/2017   TRIG 85 08/29/2017   CHOLHDL 3.0 08/29/2017   Last hemoglobin A1c No results found for: HGBA1C Last thyroid functions Lab Results  Component Value Date   TSH 1.100 11/30/2018      Objective    BP 104/60 (BP Location: Right Arm, Patient Position: Sitting, Cuff Size: Large)   Pulse 64   Temp 97.6 F (36.4 C) (Temporal)   Resp 16   Ht '5\' 3"'  (1.6 m)   Wt 168 lb (76.2 kg)   SpO2 99%   BMI 29.76 kg/m  BP Readings from Last 3 Encounters:  04/21/21 104/60  02/04/21 136/65  04/17/20 115/72   Wt Readings from Last 3 Encounters:  04/21/21 168 lb (76.2 kg)  02/04/21 168 lb (76.2 kg)  04/17/20 169 lb 9.6 oz (76.9 kg)     Physical Exam Vitals reviewed.  Constitutional:       General: She is not in acute distress.    Appearance: Normal appearance. She is well-developed.  She is not diaphoretic.  HENT:     Head: Normocephalic and atraumatic.  Eyes:     General: No scleral icterus.    Conjunctiva/sclera: Conjunctivae normal.  Neck:     Thyroid: No thyromegaly.  Cardiovascular:     Rate and Rhythm: Normal rate and regular rhythm.     Pulses: Normal pulses.     Heart sounds: Normal heart sounds. No murmur heard. Pulmonary:     Effort: Pulmonary effort is normal. No respiratory distress.     Breath sounds: Normal breath sounds. No wheezing, rhonchi or rales.  Abdominal:     General: There is no distension.     Palpations: Abdomen is soft.     Tenderness: There is abdominal tenderness (mild, RLQ, LLQ).  Musculoskeletal:     Cervical back: Neck supple.     Right lower leg: No edema.     Left lower leg: No edema.  Lymphadenopathy:     Cervical: No cervical adenopathy.  Skin:    General: Skin is warm and dry.     Findings: No rash.  Neurological:     Mental Status: She is alert and oriented to person, place, and time. Mental status is at baseline.  Psychiatric:        Attention and Perception: Attention normal.        Mood and Affect: Affect normal. Mood is depressed.        Speech: Speech normal.        Behavior: Behavior normal.        Thought Content: Thought content includes suicidal ideation. Thought content does not include homicidal ideation. Thought content does not include homicidal or suicidal plan.     Hypertrophic burn scar     Last depression screening scores PHQ 2/9 Scores 04/21/2021 04/17/2020 11/30/2018  PHQ - 2 Score '6 3 1  ' PHQ- 9 Score '27 10 7   ' Last fall risk screening Fall Risk  04/21/2021  Falls in the past year? 0  Number falls in past yr: 0  Injury with Fall? 0  Risk for fall due to : No Fall Risks  Follow up Falls evaluation completed   Last Audit-C alcohol use screening Alcohol Use Disorder Test (AUDIT) 04/21/2021  1.  How often do you have a drink containing alcohol? 1  2. How many drinks containing alcohol do you have on a typical day when you are drinking? 0  3. How often do you have six or more drinks on one occasion? 0  AUDIT-C Score 1  Alcohol Brief Interventions/Follow-up -   A score of 3 or more in women, and 4 or more in men indicates increased risk for alcohol abuse, EXCEPT if all of the points are from question 1   No results found for any visits on 04/21/21.  Assessment & Plan    Routine Health Maintenance and Physical Exam  Exercise Activities and Dietary recommendations  Goals   None     Immunization History  Administered Date(s) Administered   Tdap 02/26/2014    Health Maintenance  Topic Date Due   COVID-19 Vaccine (1) 05/07/2021 (Originally 06/30/1991)   INFLUENZA VACCINE  08/14/2021 (Originally 12/15/2020)   PAP SMEAR-Modifier  10/30/2022   TETANUS/TDAP  02/27/2024   Hepatitis C Screening  Completed   HIV Screening  Completed   Pneumococcal Vaccine 19-6 Years old  Aged Out   HPV VACCINES  Aged Out    Discussed health benefits of physical activity, and encouraged her to engage in regular  exercise appropriate for her age and condition.  Problem List Items Addressed This Visit       Musculoskeletal and Integument   Hypertrophic burn scar    Offered Derm referral - patient declines at this time Discussed scar massage and emollient use        Other   Overweight    Discussed importance of healthy weight management Discussed diet and exercise       Family history of diabetes mellitus   Relevant Orders   Hemoglobin A1c   Bipolar 2 disorder (HCC)    Chronic and uncontrolled with severe depression currently Contracted for safety - passive SI, no plan Discussed 988 hotline, calling 911 if needed and [facility] Will refer to psych urgentyl Start lamictal for now      Relevant Orders   Ambulatory referral to Psychiatry   GAD (generalized anxiety disorder)     Will reestablish with psych Encourage therapy      Pelvic pain    Ongoing - will f/u with GYN      Passive suicidal ideations    As above, contracted for safety No active plan Will start lamictal and refer to psych Resources given      Relevant Orders   Ambulatory referral to Psychiatry   Other Visit Diagnoses     Encounter for annual physical exam    -  Primary   Relevant Orders   Comprehensive metabolic panel   CBC   Lipid panel   Hemoglobin A1c        Return in about 1 year (around 04/21/2022) for CPE.     I, Lavon Paganini, MD, have reviewed all documentation for this visit. The documentation on 04/21/21 for the exam, diagnosis, procedures, and orders are all accurate and complete.   Cristyn Crossno, Dionne Bucy, MD, MPH [facility]

## 2021-04-21 NOTE — Assessment & Plan Note (Signed)
Ongoing - will f/u with GYN

## 2021-04-21 NOTE — Assessment & Plan Note (Signed)
Offered Derm referral - patient declines at this time Discussed scar massage and emollient use

## 2021-04-21 NOTE — Assessment & Plan Note (Signed)
Chronic and uncontrolled with severe depression currently Contracted for safety - passive SI, no plan Discussed 988 hotline, calling 911 if needed and [facility] Will refer to psych urgentyl Start lamictal for now

## 2021-04-21 NOTE — Assessment & Plan Note (Signed)
Discussed importance of healthy weight management Discussed diet and exercise  

## 2021-04-21 NOTE — Assessment & Plan Note (Signed)
Will reestablish with psych Encourage therapy

## 2021-04-22 LAB — CBC
Hematocrit: 36 % (ref 34.0–46.6)
Hemoglobin: 12.2 g/dL (ref 11.1–15.9)
MCH: 29 pg (ref 26.6–33.0)
MCHC: 33.9 g/dL (ref 31.5–35.7)
MCV: 86 fL (ref 79–97)
Platelets: 231 10*3/uL (ref 150–450)
RBC: 4.2 x10E6/uL (ref 3.77–5.28)
RDW: 12.9 % (ref 11.7–15.4)
WBC: 5.8 10*3/uL (ref 3.4–10.8)

## 2021-04-22 LAB — COMPREHENSIVE METABOLIC PANEL
ALT: 9 IU/L (ref 0–32)
AST: 14 IU/L (ref 0–40)
Albumin/Globulin Ratio: 1.8 (ref 1.2–2.2)
Albumin: 4.6 g/dL (ref 3.8–4.8)
Alkaline Phosphatase: 68 IU/L (ref 44–121)
BUN/Creatinine Ratio: 13 (ref 9–23)
BUN: 9 mg/dL (ref 6–20)
Bilirubin Total: 0.4 mg/dL (ref 0.0–1.2)
CO2: 22 mmol/L (ref 20–29)
Calcium: 8.8 mg/dL (ref 8.7–10.2)
Chloride: 108 mmol/L — ABNORMAL HIGH (ref 96–106)
Creatinine, Ser: 0.68 mg/dL (ref 0.57–1.00)
Globulin, Total: 2.5 g/dL (ref 1.5–4.5)
Glucose: 92 mg/dL (ref 70–99)
Potassium: 4.5 mmol/L (ref 3.5–5.2)
Sodium: 142 mmol/L (ref 134–144)
Total Protein: 7.1 g/dL (ref 6.0–8.5)
eGFR: 119 mL/min/{1.73_m2} (ref >59)

## 2021-04-22 LAB — HEMOGLOBIN A1C
Est. average glucose Bld gHb Est-mCnc: 117 mg/dL
Hgb A1c MFr Bld: 5.7 % — ABNORMAL HIGH (ref 4.8–5.6)

## 2021-04-22 LAB — LIPID PANEL
Chol/HDL Ratio: 3.5 ratio (ref 0.0–4.4)
Cholesterol, Total: 163 mg/dL (ref 100–199)
HDL: 46 mg/dL (ref >39)
LDL Chol Calc (NIH): 100 mg/dL — ABNORMAL HIGH (ref 0–99)
Triglycerides: 89 mg/dL (ref 0–149)
VLDL Cholesterol Cal: 17 mg/dL (ref 5–40)

## 2021-05-14 ENCOUNTER — Other Ambulatory Visit: Payer: Self-pay | Admitting: Family Medicine

## 2021-05-21 ENCOUNTER — Telehealth: Payer: Self-pay

## 2021-05-21 NOTE — Telephone Encounter (Signed)
will you take this patient back. received referral in work que

## 2021-05-21 NOTE — Telephone Encounter (Signed)
Yes , I can give her another chance. She was noncompliant with Rx previously. It will be an IN PERSON visit . Thank you .

## 2021-06-09 NOTE — Telephone Encounter (Signed)
Note was made in referral and pt was left a message to call office to set up a in- person appt.

## 2021-06-19 ENCOUNTER — Telehealth: Payer: Self-pay | Admitting: Family Medicine

## 2021-06-19 NOTE — Telephone Encounter (Signed)
LOV: 04/21/21 and LR qty:30 R: 1

## 2021-06-22 ENCOUNTER — Encounter: Payer: Self-pay | Admitting: Family Medicine

## 2021-06-23 ENCOUNTER — Other Ambulatory Visit: Payer: Self-pay

## 2021-07-03 NOTE — Telephone Encounter (Addendum)
Patient insurance will cover a 90 day supply only therefor patient requesting PCP please send in a new script lamoTRIgine (LAMICTAL) 25 MG tablet

## 2021-07-06 ENCOUNTER — Encounter: Payer: Self-pay | Admitting: Family Medicine

## 2021-07-06 MED ORDER — LAMOTRIGINE 25 MG PO TABS: 25.0000 mg | ORAL_TABLET | Freq: Every day | ORAL | 1 refills | Status: DC

## 2021-07-06 NOTE — Addendum Note (Signed)
Addended by: Erasmo Downer on: 07/06/2021 08:31 AM   Modules accepted: Orders

## 2021-07-21 ENCOUNTER — Telehealth (HOSPITAL_COMMUNITY): Payer: Self-pay | Admitting: Licensed Clinical Social Worker

## 2021-07-21 ENCOUNTER — Other Ambulatory Visit: Payer: Self-pay

## 2021-07-21 ENCOUNTER — Encounter: Payer: Self-pay | Admitting: Psychiatry

## 2021-07-21 ENCOUNTER — Other Ambulatory Visit
Admission: RE | Admit: 2021-07-21 | Discharge: 2021-07-21 | Disposition: A | Discharge status: Home / Self Care | Payer: 59 | Source: Ambulatory Visit | Attending: Psychiatry | Admitting: Psychiatry

## 2021-07-21 ENCOUNTER — Ambulatory Visit (INDEPENDENT_AMBULATORY_CARE_PROVIDER_SITE_OTHER): Payer: 59 | Admitting: Psychiatry

## 2021-07-21 ENCOUNTER — Ambulatory Visit
Admission: RE | Admit: 2021-07-21 | Discharge: 2021-07-21 | Disposition: A | Discharge status: Home / Self Care | Payer: 59 | Source: Ambulatory Visit | Attending: Psychiatry | Admitting: Psychiatry

## 2021-07-21 VITALS — BP 121/73 | HR 65 | Temp 98.3°F | Wt 166.6 lb

## 2021-07-21 DIAGNOSIS — Z9189 Other specified personal risk factors, not elsewhere classified: Secondary | ICD-10-CM | POA: Insufficient documentation

## 2021-07-21 DIAGNOSIS — F411 Generalized anxiety disorder: Secondary | ICD-10-CM

## 2021-07-21 DIAGNOSIS — R001 Bradycardia, unspecified: Secondary | ICD-10-CM | POA: Insufficient documentation

## 2021-07-21 DIAGNOSIS — F3181 Bipolar II disorder: Secondary | ICD-10-CM | POA: Diagnosis not present

## 2021-07-21 DIAGNOSIS — G2581 Restless legs syndrome: Secondary | ICD-10-CM | POA: Diagnosis not present

## 2021-07-21 DIAGNOSIS — F1290 Cannabis use, unspecified, uncomplicated: Secondary | ICD-10-CM | POA: Insufficient documentation

## 2021-07-21 LAB — TSH: TSH: 1.514 u[IU]/mL (ref 0.350–4.500)

## 2021-07-21 MED ORDER — CARIPRAZINE HCL 1.5 MG PO CAPS: 1.5000 mg | ORAL_CAPSULE | Freq: Every day | ORAL | 0 refills | Status: DC

## 2021-07-21 MED ORDER — HYDROXYZINE HCL 10 MG PO TABS: 10.0000 mg | ORAL_TABLET | Freq: Two times a day (BID) | ORAL | 1 refills | Status: DC | PRN

## 2021-07-21 MED ORDER — LAMOTRIGINE 25 MG PO TABS: 25.0000 mg | ORAL_TABLET | Freq: Two times a day (BID) | ORAL | 0 refills | Status: DC

## 2021-07-21 NOTE — Progress Notes (Signed)
[facility] MD OP Progress Note  07/21/2021 12:58 PM Suzanne Boyd  MRN:  [id number]  Chief Complaint:  Chief Complaint  Patient presents with   Follow-up: 31 year old Caucasian female, employed, divorced, lives in [place], has a history of bipolar disorder type II, generalized anxiety disorder, noncompliant with treatment recommendation, presented for follow-up appointment for medication management.   HPI: Suzanne Boyd is a 31 year old Caucasian female, employed, divorced, lives in [place], has a history of bipolar disorder, GAD, insomnia, restless leg syndrome was evaluated in office today.  Patient was last seen on 09/13/2018.  Patient today returns reporting worsening mood symptoms.  Patient currently struggles with depressive symptoms like sadness, low motivation, low energy, concentration problems, sleep problems.  Patient also reports racing thoughts, being extremely anxious all the time.  Patient reports her mood symptoms as worsening since the past several months.  Patient currently only on Lamictal, prescribed by writer in the past, recently prescribed by her primary care provider.  Reports she has been taking the Lamictal 25 mg once a day.  Denies side effects.  Patient reports sleep problems, reports she has difficulty falling asleep.  She reports she is often thinking about a lot of things, has racing thoughts which does have an impact on her sleep.  Getting worse since the past several months.  Currently not on any sleep medications.  Patient reports passive thoughts of not wanting to be here, currently however denies any active suicidal thoughts or plan.  Denies any past history of suicide attempts.  Reports her ex-boyfriend is a good support system.  She reports she gave away her gun 8 months ago to her ex-boyfriend since she did not want to keep it with her.  8 months ago she had suicidality and had fleeting thoughts about using her gun however she was able to cope with that thought at that  time.  Patient does report being paranoid, unknown if this is true paranoia, reports she looks around herself a lot just to make sure there is no one around her.  Patient reports paranoia as getting worse since the past 1 year.  Currently does use cannabis twice a day, likely also contributed by the same.  Will need to explore this in future sessions.  Patient denies any homicidality.  Patient also has support system from her dad who lives in [place] as well as her sister who stays with her a couple of times a week.  Patient denies any other concerns today.  Visit Diagnosis:    ICD-10-CM   1. Bipolar 2 disorder, major depressive episode (HCC)  F31.81 cariprazine (VRAYLAR) 1.5 MG capsule    lamoTRIgine (LAMICTAL) 25 MG tablet    TSH    EKG 12-Lead    2. GAD (generalized anxiety disorder)  F41.1 hydrOXYzine (ATARAX) 10 MG tablet    TSH    3. RLS (restless legs syndrome)  G25.81     4. At risk for prolonged QT interval syndrome  Z91.89 EKG 12-Lead    5. Long term current use of cannabis  F12.90       Past Psychiatric History: Reviewed past psychiatric history from progress note on 10/25/2017.  Past trials of Paxil, Lexapro, Abilify, risperidone, Wellbutrin.  Past Medical History:  Past Medical History:  Diagnosis Date   Anxiety    BRCA negative 10/2019   MyRIsk neg   Depression    Family history of breast cancer    Increased risk of breast cancer 10/2019   IBIS=19.4%/riskscore=22.2%   LGSIL on  Pap smear of cervix     Past Surgical History:  Procedure Laterality Date   INTRAUTERINE DEVICE (IUD) INSERTION      Family Psychiatric History: Reviewed family psychiatric history from progress note on 10/25/2017.  Family History:  Family History  Problem Relation Age of Onset   Hyperlipidemia Mother    Hypertension Mother    Varicose Veins Mother    Hypertension Father    Thyroid disease Father    Healthy Brother    Bipolar disorder Maternal Aunt    Cancer - Colon Maternal  Uncle    Thyroid disease Maternal Grandfather    Cancer Maternal Grandmother 68       Breast   Depression Maternal Grandmother    Brain cancer Maternal Grandmother    Cancer Other 53       breast   Anxiety disorder Other    Cancer Other 55       breast   Cancer Other 55       breast   Cancer Other 55       breast   Cancer Other 60       breast   Cancer Other 60       breast   Cancer Other 60       breast   Ovarian cancer Neg Hx     Social History: Reviewed social history from progress note from 10/25/2017. Social History   Socioeconomic History   Marital status: Divorced    Spouse name: Not on file   Number of children: 0   Years of education: 12   Highest education level: High school graduate  Occupational History   Occupation: Biochemist, clinical    Comment: full time  Tobacco Use   Smoking status: Never   Smokeless tobacco: Never  Vaping Use   Vaping Use: Never used  Substance and Sexual Activity   Alcohol use: Not Currently    Comment: occasional- 1 drink per month   Drug use: Yes    Types: Marijuana    Comment: last used 3 weeks ago   Sexual activity: Yes    Partners: Male    Birth control/protection: I.U.D.    Comment: Mirena  Other Topics Concern   Not on file  Social History Narrative   Not on file   Social Determinants of Health   Financial Resource Strain: Not on file  Food Insecurity: Not on file  Transportation Needs: Not on file  Physical Activity: Not on file  Stress: Not on file  Social Connections: Not on file    Allergies: No Known Allergies  Metabolic Disorder Labs: Lab Results  Component Value Date   HGBA1C 5.7 (H) 04/21/2021   No results found for: PROLACTIN Lab Results  Component Value Date   CHOL 163 04/21/2021   TRIG 89 04/21/2021   HDL 46 04/21/2021   CHOLHDL 3.5 04/21/2021   LDLCALC 100 (H) 04/21/2021   LDLCALC 86 08/29/2017   Lab Results  Component Value Date   TSH 1.514 07/21/2021   TSH 1.100 11/30/2018     Therapeutic Level Labs: No results found for: LITHIUM No results found for: VALPROATE No components found for:  CBMZ  Current Medications: Current Outpatient Medications  Medication Sig Dispense Refill   cariprazine (VRAYLAR) 1.5 MG capsule Take 1 capsule (1.5 mg total) by mouth daily. 30 capsule 0   hydrOXYzine (ATARAX) 10 MG tablet Take 1 tablet (10 mg total) by mouth 2 (two) times daily as needed for anxiety. For severe  anxiety 60 tablet 1   levonorgestrel (MIRENA) 20 MCG/24HR IUD 1 each by Intrauterine route once.     lamoTRIgine (LAMICTAL) 25 MG tablet Take 1 tablet (25 mg total) by mouth 2 (two) times daily. 60 tablet 0   No current facility-administered medications for this visit.     Musculoskeletal: Strength & Muscle Tone: within normal limits Gait & Station: normal Patient leans: N/A  Psychiatric Specialty Exam: Review of Systems  Psychiatric/Behavioral:  Positive for decreased concentration, dysphoric mood and sleep disturbance. The patient is nervous/anxious.   All other systems reviewed and are negative.  Blood pressure 121/73, pulse 65, temperature 98.3 F (36.8 C), temperature source Temporal, weight 166 lb 9.6 oz (75.6 kg).Body mass index is 29.51 kg/m.  General Appearance: Casual  Eye Contact:  Fair  Speech:  Normal Rate  Volume:  Normal  Mood:  Anxious and Depressed  Affect:  Tearful  Thought Process:  Goal Directed and Descriptions of Associations: Intact  Orientation:  Full (Time, Place, and Person)  Thought Content: Logical , does report paranoia , when she looks around her to make sure there is no one , will need to explore this more, unknown if true psychosis  Suicidal Thoughts:  No  Homicidal Thoughts:  No  Memory:  Immediate;   Fair Recent;   Fair Remote;   Fair  Judgement:  Fair  Insight:  Fair  Psychomotor Activity:  Normal  Concentration:  Concentration: Good and Attention Span: Good  Recall:  Good  Fund of Knowledge: Fair  Language:  Fair  Akathisia:  No  Handed:  Right  AIMS (if indicated): done,0  Assets:  Communication Skills Desire for Improvement Housing Social Support  ADL's:  Intact  Cognition: WNL  Sleep:  Poor   Screenings: GAD-7    Flowsheet Row Office Visit from 07/21/2021 in [facility]  Total GAD-7 Score 21      PHQ2-9    [place] Visit from 07/21/2021 in [facility] Office Visit from 04/21/2021 in [facility] Visit from 04/17/2020 in [facility] Visit from 11/30/2018 in [facility] Visit from 08/29/2017 in [facility]  PHQ-2 Total Score '6 6 3 1 6  ' PHQ-9 Total Score '20 27 10 7 21      ' [place] Office Visit from 07/21/2021 in [facility] from 02/04/2021 in [facility] [place] No Risk        Assessment and Plan: Elliemae Braman is a 31 year old Caucasian female who has a history of bipolar disorder type II, GAD, was evaluated in office today.  Patient noncompliant with treatment plan, last appointment being 09/13/2018, returns today to restart medications, presented with worsening mood symptoms.  Plan as noted below. The patient demonstrates the following risk factors for suicide: Chronic risk factors for suicide include: psychiatric disorder of bipolar do, anxiety  and substance use disorder. Acute risk factors for suicide include:  passive SI . Protective factors for this patient include: positive social support, positive therapeutic relationship, coping skills, hope for the future, and life satisfaction. Considering these factors, the overall suicide risk at this point appears to be low. Patient is appropriate for outpatient follow up.  Plan Bipolar disorder type II, major depressive episode-unstable Start Vraylar 1.5 mg p.o. daily Increase Lamictal to 25 mg p.o. twice  daily  GAD-unstable Start hydroxyzine 25 mg p.o. twice daily as needed for  severe anxiety. Referral for CBT, provided information for comprehensive counseling in [place].  Long-term use of cannabis-rule out cannabis use disorder-unstable Provided counseling.  At risk for prolonged QT syndrome-provided phone #[phone] to get EKG.  We will order EKG.  Will order labs-TSH-patient provided lab slip.  Reviewed and discussed labs-lipid panel-04/21/2021-LDL slightly elevated at 100 otherwise within normal limits, hemoglobin A1c-elevated at 5.7, CMP and CBC within normal limits.  Crisis plan discussed with patient.  Patient provided information for [facility].  Patient also has good support system from sister as well as father.  Follow-up in clinic in 1 week or sooner if needed in person.  Collaboration of Care: Collaboration of Care: Referral or follow-up with counselor/therapist [facility] provided patient community resources to establish care with a therapist and Other also I have sent referral to partial hospitalization program at [facility] health.  Patient/Guardian was advised Release of Information must be obtained prior to any record release in order to collaborate their care with an outside provider. Patient/Guardian was advised if they have not already done so to contact the registration department to sign all necessary forms in order for [place] to release information regarding their care.   Consent: Patient/Guardian gives verbal consent for treatment and assignment of benefits for services provided during this visit. Patient/Guardian expressed understanding and agreed to proceed.  This note was generated in part or whole with voice recognition software. Voice recognition is usually quite accurate but there are transcription errors that can and very often do occur. I apologize for any typographical errors that were not detected and corrected.       Ursula Alert,  MD 07/21/2021, 12:58 PM

## 2021-07-21 NOTE — Patient Instructions (Signed)
?Cariprazine Capsules ?What is this medication? ?CARIPRAZINE (car i PRA zeen) treats schizophrenia and bipolar disorder. It works by balancing the levels of dopamine and serotonin in your brain, substances that help regulate mood, behaviors, and thoughts. It belongs to a group of medications called antipsychotics. Antipsychotic medications can be used to treat several kinds of mental health conditions. ?This medicine may be used for other purposes; ask your health care provider or pharmacist if you have questions. ?COMMON BRAND NAME(S): VRAYLAR ?What should I tell my care team before I take this medication? ?They need to know if you have any of these conditions: ?Dementia ?Diabetes ?Difficulty swallowing ?Have trouble controlling your muscles ?Heart disease ?High cholesterol ?History of breast cancer ?History of stroke ?Kidney disease ?Liver disease ?Low blood counts, like low white cell, platelet, or red cell counts ?Low blood pressure ?Parkinson's disease ?Seizures ?Suicidal thoughts, plans or attempt; a previous suicide attempt by you or a family member ?An unusual or allergic reaction to cariprazine, other medications, foods, dyes, or preservatives ?Pregnant or trying to get pregnant ?Breast-feeding ?How should I use this medication? ?Take this medication by mouth with a glass of water. Follow the directions on the prescription label. You may take it with or without food. Take your medication at regular intervals. Do not take it more often than directed. Do not stop taking except on your care team's advice. ?A special MedGuide will be given to you by the pharmacist with each prescription and refill. Be sure to read this information carefully each time. ?Talk to your care team about the use of this medication in children. Special care may be needed. ?Overdosage: If you think you have taken too much of this medicine contact a poison control center or emergency room at once. ?NOTE: This medicine is only for you.  Do not share this medicine with others. ?What if I miss a dose? ?If you miss a dose, take it as soon as you can. If it is almost time for your next dose, take only that dose. Do not take double or extra doses. ?What may interact with this medication? ?Do not take this medication with any of the following: ?Metoclopramide ?This medication may also interact with the following: ?Antihistamines for allergy, cough, and cold ?Carbamazepine ?Certain medications for anxiety or sleep ?Certain medications for depression like amitriptyline, fluoxetine, sertraline ?Certain medications for fungal infections like itraconazole, ketoconazole ?General anesthetics like halothane, isoflurane, methoxyflurane, propofol ?Levodopa or other medications for Parkinson's disease ?Medications for blood pressure ?Medications for seizures ?Medications that relax muscles for surgery ?Narcotic medications for pain ?Phenothiazines like chlorpromazine, prochlorperazine, thioridazine ?Rifampin ?This list may not describe all possible interactions. Give your health care provider a list of all the medicines, herbs, non-prescription drugs, or dietary supplements you use. Also tell them if you smoke, drink alcohol, or use illegal drugs. Some items may interact with your medicine. ?What should I watch for while using this medication? ?Visit your care team for regular checks on your progress. Tell your care team if symptoms do not start to get better or if they get worse. Do not stop taking except on your care team's advice. You may develop a severe reaction. Your care team will tell you how much medication to take. ?Patients and their families should watch out for new or worsening depression or thoughts of suicide. Also watch out for sudden changes in feelings such as feeling anxious, agitated, panicky, irritable, hostile, aggressive, impulsive, severely restless, overly excited and hyperactive, or not being able to  sleep. If this happens, especially at  the beginning of treatment or after a change in dose, call your care team. ?You may get dizzy or drowsy. Do not drive, use machinery, or do anything that needs mental alertness until you know how this medication affects you. Do not stand or sit up quickly, especially if you are an older patient. This reduces the risk of dizzy or fainting spells. Alcohol may interfere with the effect of this medication. Avoid alcoholic drinks. ?This medication may cause dry eyes and blurred vision. If you wear contact lenses you may feel some discomfort. Lubricating drops may help. See your eye doctor if the problem does not go away or is severe. ?This medication may increase blood sugar. Ask your care team if changes in diet or medications are needed if you have diabetes. ?This medication can cause problems with controlling your body temperature. It can lower the response of your body to cold temperatures. If possible, stay indoors during cold weather. If you must go outdoors, wear warm clothes. It can also lower the response of your body to heat. Do not overheat. Do not over-exercise. Stay out of the sun when possible. If you must be in the sun, wear cool clothing. Drink plenty of water. If you have trouble controlling your body temperature, call your care team right away. ?Women should inform their care team if they wish to become pregnant or think they might be pregnant. The effects of this medication on an unborn child are not known. A registry is available to monitor pregnancy outcomes in pregnant women exposed to this medication or similar medications. Talk to your care team or pharmacist for more information. ?What side effects may I notice from receiving this medication? ?Side effects that you should report to your care team as soon as possible: ?Allergic reactions--skin rash, itching, hives, swelling of the face, lips, tongue, or throat ?High blood sugar (hyperglycemia)--increased thirst or amount of urine, unusual weakness  or fatigue, blurry vision ?High fever, stiff muscles, increased sweating, fast or irregular heartbeat, and confusion, which may be signs of neuroleptic malignant syndrome ?Infection--fever, chills, cough, or sore throat ?Low blood pressure--dizziness, feeling faint or lightheaded, blurry vision ?Pain or trouble swallowing ?Seizures ?Stroke--sudden numbness or weakness of the face, arm, or leg, trouble speaking, confusion, trouble walking, loss of balance or coordination, dizziness, severe headache, change in vision ?Thoughts of suicide or self-harm, worsening mood, feelings of depression ?Uncontrolled and repetitive body movements, muscle stiffness or spasms, tremors or shaking, loss of balance or coordination, restlessness, shuffling walk, which may be signs of extrapyramidal symptoms (EPS) ?Side effects that usually do not require medical attention (report to your care team if they continue or are bothersome): ?Constipation ?Drowsiness ?Nausea ?Upset stomach ?Vomiting ?This list may not describe all possible side effects. Call your doctor for medical advice about side effects. You may report side effects to FDA at 1-800-FDA-1088. ?Where should I keep my medication? ?Keep out of the reach of children. ?Store at room temperature between 15 and 30 degrees C (59 and 86 degrees F). Protect from light. Throw away any unused medication after the expiration date. ?NOTE: This sheet is a summary. It may not cover all possible information. If you have questions about this medicine, talk to your doctor, pharmacist, or health care provider. ?? 2022 Elsevier/Gold Standard (2021-01-21 00:00:00) ? ? ?Please call for EKG - [phone] ? ?[facility] ?[email or web address] ?[address] [place], [place] [address] ? ~2.7 mi ?[phone] ?        ? ? ?

## 2021-07-28 ENCOUNTER — Ambulatory Visit (INDEPENDENT_AMBULATORY_CARE_PROVIDER_SITE_OTHER): Payer: 59 | Admitting: Psychiatry

## 2021-07-28 ENCOUNTER — Encounter: Payer: Self-pay | Admitting: Psychiatry

## 2021-07-28 ENCOUNTER — Other Ambulatory Visit: Payer: Self-pay

## 2021-07-28 VITALS — BP 114/72 | HR 54 | Temp 98.3°F | Wt 162.6 lb

## 2021-07-28 DIAGNOSIS — G2581 Restless legs syndrome: Secondary | ICD-10-CM

## 2021-07-28 DIAGNOSIS — F3132 Bipolar disorder, current episode depressed, moderate: Secondary | ICD-10-CM

## 2021-07-28 DIAGNOSIS — Z9189 Other specified personal risk factors, not elsewhere classified: Secondary | ICD-10-CM

## 2021-07-28 DIAGNOSIS — F411 Generalized anxiety disorder: Secondary | ICD-10-CM

## 2021-07-28 DIAGNOSIS — F1290 Cannabis use, unspecified, uncomplicated: Secondary | ICD-10-CM

## 2021-07-28 MED ORDER — CARIPRAZINE HCL 3 MG PO CAPS: 3.0000 mg | ORAL_CAPSULE | Freq: Every day | ORAL | 1 refills | Status: DC

## 2021-07-28 MED ORDER — LAMOTRIGINE 25 MG PO TABS: 25.0000 mg | ORAL_TABLET | Freq: Two times a day (BID) | ORAL | 1 refills | Status: DC

## 2021-07-28 NOTE — Progress Notes (Signed)
[facility] MD OP Progress Note ? ?07/28/2021 3:22 PM ?Suzanne Boyd  ?MRN:  [id number] ? ?Chief Complaint:  ?Chief Complaint  ?Patient presents with  ? Follow-up; 31 year old Caucasian female,has a history of bipolar disorder type II, GAD, RLS, presented for medication management.  ? ?HPI: Suzanne Boyd is a 31 year old Caucasian female, employed, divorced, lives in [place], has a history of bipolar disorder, GAD, insomnia, restless leg syndrome was evaluated in office today. ? ?Patient reports she is currently tolerating the Vraylar.  Patient reports she has noticed some improvement in her mood and her depressive symptoms compared to how she was doing a week ago.  Patient however reports she continues to have racing thoughts, and is interested in dosage increase. ? ?Continues to have anxiety symptoms, worrying about things in general, reports she has been noncompliant with psychotherapy referral.  She agrees to give the therapist a call today. ? ?Reports she continues to have passive thoughts of not wanting to be here however that has improved since her last visit and the last time she had such thoughts was a week ago.  Denies any active suicidal thoughts or plan. ? ?Reports work is going well, he does stay busy. ? ?She does have good support system from her dad as well as her sister who lives with her on and off. ? ?Denies any AH or VH however does have chronic paranoia although improving. ? ?Denies any other concerns today. ? ? ? ?Visit Diagnosis:  ?  ICD-10-CM   ?1. Bipolar 1 disorder, depressed, moderate (HCC)  F31.32 cariprazine (VRAYLAR) 3 MG capsule  ?  lamoTRIgine (LAMICTAL) 25 MG tablet  ?  ?2. GAD (generalized anxiety disorder)  F41.1   ?  ?3. RLS (restless legs syndrome)  G25.81   ?  ?4. At risk for prolonged QT interval syndrome  Z91.89   ?  ?5. Long term current use of cannabis  F12.90   ?  ? ? ?Past Psychiatric History: Reviewed past psychiatric history from progress note from 10/25/2017.  Past trials of  Paxil, Lexapro, Abilify, risperidone, Wellbutrin. ? ?Past Medical History:  ?Past Medical History:  ?Diagnosis Date  ? Anxiety   ? BRCA negative 10/2019  ? MyRIsk neg  ? Depression   ? Family history of breast cancer   ? Increased risk of breast cancer 10/2019  ? IBIS=19.4%/riskscore=22.2%  ? LGSIL on Pap smear of cervix   ?  ?Past Surgical History:  ?Procedure Laterality Date  ? INTRAUTERINE DEVICE (IUD) INSERTION    ? ? ?Family Psychiatric History: Reviewed family psychiatric history from progress note on 10/25/2017. ? ?Family History:  ?Family History  ?Problem Relation Age of Onset  ? Hyperlipidemia Mother   ? Hypertension Mother   ? Varicose Veins Mother   ? Hypertension Father   ? Thyroid disease Father   ? Healthy Brother   ? Bipolar disorder Maternal Aunt   ? Cancer - Colon Maternal Uncle   ? Thyroid disease Maternal Grandfather   ? Cancer Maternal Grandmother 67  ?     Breast  ? Depression Maternal Grandmother   ? Brain cancer Maternal Grandmother   ? Cancer Other 55  ?     breast  ? Anxiety disorder Other   ? Cancer Other 55  ?     breast  ? Cancer Other 55  ?     breast  ? Cancer Other 55  ?     breast  ? Cancer Other 60  ?  breast  ? Cancer Other 60  ?     breast  ? Cancer Other 60  ?     breast  ? Ovarian cancer Neg Hx   ? ? ?Social History: Reviewed social history from progress note on 10/25/2017. ?Social History  ? ?Socioeconomic History  ? Marital status: Divorced  ?  Spouse name: Not on file  ? Number of children: 0  ? Years of education: 51  ? Highest education level: High school graduate  ?Occupational History  ? 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 Smoking status: Never  ? Smokeless tobacco: Never  ?Vaping Use  ? Vaping Use: Never used  ?Substance and Sexual Activity  ? Alcohol use: Not Currently  ?  Comment: occasional- 1 drink per month  ? Drug use: Yes  ?  Types: Marijuana  ?  Comment: last used 3 weeks ago  ? Sexual activity: Yes  ?  Partners: Male  ?  Birth  control/protection: I.U.D.  ?  Comment: Mirena  ?Other Topics Concern  ? Not on file  ?Social History Narrative  ? Not on file  ? ?Social Determinants of Health  ? ?Financial Resource Strain: Not on file  ?Food Insecurity: Not on file  ?Transportation Needs: Not on file  ?Physical Activity: Not on file  ?Stress: Not on file  ?Social Connections: Not on file  ? ? ?Allergies: No Known Allergies ? ?Metabolic Disorder Labs: ?Lab Results  ?Component Value Date  ? HGBA1C 5.7 (H) 04/21/2021  ? ?No results found for: PROLACTIN ?Lab Results  ?Component Value Date  ? CHOL 163 04/21/2021  ? TRIG 89 04/21/2021  ? HDL 46 04/21/2021  ? CHOLHDL 3.5 04/21/2021  ? LDLCALC 100 (H) 04/21/2021  ? [place] 86 08/29/2017  ? ?Lab Results  ?Component Value Date  ? TSH 1.514 07/21/2021  ? TSH 1.100 11/30/2018  ? ? ?Therapeutic Level Labs: ?No results found for: LITHIUM ?No results found for: VALPROATE ?No components found for:  CBMZ ? ?Current Medications: ?Current Outpatient Medications  ?Medication Sig Dispense Refill  ? cariprazine (VRAYLAR) 3 MG capsule Take 1 capsule (3 mg total) by mouth daily. 30 capsule 1  ? hydrOXYzine (ATARAX) 10 MG tablet Take 1 tablet (10 mg total) by mouth 2 (two) times daily as needed for anxiety. For severe anxiety 60 tablet 1  ? levonorgestrel (MIRENA) 20 MCG/24HR IUD 1 each by Intrauterine route once.    ? lamoTRIgine (LAMICTAL) 25 MG tablet Take 1 tablet (25 mg total) by mouth 2 (two) times daily. 60 tablet 1  ? ?No current facility-administered medications for this visit.  ? ? ? ?Musculoskeletal: ?Strength & Muscle Tone: within normal limits ?Gait & Station: normal ?Patient leans: N/A ? ?Psychiatric Specialty Exam: ?Review of Systems  ?Psychiatric/Behavioral:  Positive for dysphoric mood. The patient is nervous/anxious.   ?All other systems reviewed and are negative.  ?Blood pressure 114/72, pulse (!) 54, temperature 98.3 ?F (36.8 ?C), temperature source Temporal, weight 162 lb 9.6 oz (73.8 kg), last  menstrual period 07/14/2021.Body mass index is 28.8 kg/m?.  ?General Appearance: Casual  ?Eye Contact:  Fair  ?Speech:  Clear and Coherent  ?Volume:  Normal  ?Mood:  Anxious and Depressed  ?Affect:  Congruent  ?Thought Process:  Goal Directed and Descriptions of Associations: Intact  ?Orientation:  Full (Time, Place, and Person)  ?Thought Content: Paranoid Ideation and Rumination improving  ?Suicidal Thoughts:  No  ?Homicidal Thoughts:  No  ?Memory:  Immediate;   Fair ?Recent;   Fair ?Remote;   Fair  ?Judgement:  Fair  ?Insight:  Fair  ?Psychomotor Activity:  Normal  ?Concentration:  Concentration: Fair and Attention Span: Fair  ?Recall:  Fair  ?Fund of Knowledge: Fair  ?Language: Fair  ?Akathisia:  No  ?Handed:  Right  ?AIMS (if indicated): done,0  ?Assets:  Communication Skills ?Desire for Improvement ?Housing ?Social Support ?Talents/Skills ?Transportation  ?ADL's:  Intact  ?Cognition: WNL  ?Sleep:  Fair  ? ?Screenings: ?AIMS   ? ?[place] Office Visit from 07/28/2021 in [place]  ?AIMS Total Score 0  ? ?  ? ?GAD-7   ? ?[place] Office Visit from 07/21/2021 in [place]  ?Total GAD-7 Score 21  ? ?  ? ?PHQ2-9   ? ?[place] Office Visit from 07/28/2021 in [place] Office Visit from 07/21/2021 in [place] Office Visit from 04/21/2021 in [place] Visit from 04/17/2020 in [place] Visit from 11/30/2018 in [place]  ?PHQ-2 Total Score '4 6 6 3 1  ' ?PHQ-9 Total Score '17 20 27 10 7  ' ? ?  ? ?[place] Office Visit from 07/28/2021 in [place] Office Visit from 07/21/2021 in [place] ED from 02/04/2021 in [facility]   ?C-SSRS RISK CATEGORY Low Risk Low Risk No Risk  ? ?  ? ? ? ?Assessment and Plan: Suzanne Boyd is a 31 year old Caucasian  female who has a history of bipolar disorder type II, GAD was evaluated in office today.  Patient continues to have mood symptoms, paranoia, will benefit from medication readjustment. ? ?Plan ?Bipolar disorder type

## 2021-08-07 ENCOUNTER — Encounter: Payer: Self-pay | Admitting: Psychiatry

## 2021-08-07 ENCOUNTER — Ambulatory Visit (INDEPENDENT_AMBULATORY_CARE_PROVIDER_SITE_OTHER): Payer: 59 | Admitting: Psychiatry

## 2021-08-07 ENCOUNTER — Other Ambulatory Visit: Payer: Self-pay

## 2021-08-07 VITALS — BP 117/75 | HR 56 | Temp 98.3°F | Wt 162.6 lb

## 2021-08-07 DIAGNOSIS — G2581 Restless legs syndrome: Secondary | ICD-10-CM | POA: Diagnosis not present

## 2021-08-07 DIAGNOSIS — F411 Generalized anxiety disorder: Secondary | ICD-10-CM

## 2021-08-07 DIAGNOSIS — F1290 Cannabis use, unspecified, uncomplicated: Secondary | ICD-10-CM | POA: Diagnosis not present

## 2021-08-07 DIAGNOSIS — F3175 Bipolar disorder, in partial remission, most recent episode depressed: Secondary | ICD-10-CM

## 2021-08-07 NOTE — Progress Notes (Signed)
[facility] MD OP Progress Note ? ?08/07/2021 12:52 PM ?Suzanne Boyd  ?MRN:  [id number] ? ?Chief Complaint:  ?Chief Complaint  ?Patient presents with  ? Follow-up: 31 year old Caucasian female has a history of bipolar disorder type II, GAD, RLS, presented for medication management.  ? ?HPI: Suzanne Boyd is a 31 year old Caucasian female, employed, divorced, lives in [place], has a history of bipolar disorder, GAD, insomnia, restless leg syndrome was evaluated in office today. ? ?Patient today reports she is currently improving with regards to her mood.  Denies any significant mood lability. ? ?Patient reports she continues to have anxiety, worrying about things in general.  Although it has improved she continues to have symptoms.  She is currently compliant on medications including hydroxyzine which helps. ? ?Patient denies any active suicidal thoughts or plan today, reports she does have passive thoughts of not wanting to be here and the last time may have been a week ago however she did not have any plan.  Patient reports she does have good support system from friends as well as her sister and she talks to them when she feels that way.  She has not been able to find a therapist yet however is trying to find a therapist and is motivated to do so. ? ?Patient reports sleep is good. ? ?Patient reports she was able to get a shift change at work to work during the day.  She reports she is going to start training in April.  Looks forward to that. ? ?Patient denies any side effects to her medications. ? ?Denies any other concerns today. ? ?Visit Diagnosis:  ?  ICD-10-CM   ?1. Bipolar 1 disorder, depressed, partial remission (HCC)  F31.75   ? moderate  ?  ?2. GAD (generalized anxiety disorder)  F41.1   ?  ?3. RLS (restless legs syndrome)  G25.81   ?  ?4. Long term current use of cannabis  F12.90   ?  ? ? ?Past Psychiatric History: Reviewed past psychiatric history from progress note on 10/25/2017.  Past trials of Paxil, Lexapro,  Abilify, risperidone, Wellbutrin. ? ?Past Medical History:  ?Past Medical History:  ?Diagnosis Date  ? Anxiety   ? BRCA negative 10/2019  ? MyRIsk neg  ? Depression   ? Family history of breast cancer   ? Increased risk of breast cancer 10/2019  ? IBIS=19.4%/riskscore=22.2%  ? LGSIL on Pap smear of cervix   ?  ?Past Surgical History:  ?Procedure Laterality Date  ? INTRAUTERINE DEVICE (IUD) INSERTION    ? ? ?Family Psychiatric History: Reviewed family psychiatric history from progress note on 10/25/2017. ? ?Family History:  ?Family History  ?Problem Relation Age of Onset  ? Hyperlipidemia Mother   ? Hypertension Mother   ? Varicose Veins Mother   ? Hypertension Father   ? Thyroid disease Father   ? Healthy Brother   ? Bipolar disorder Maternal Aunt   ? Cancer - Colon Maternal Uncle   ? Thyroid disease Maternal Grandfather   ? Cancer Maternal Grandmother 32  ?     Breast  ? Depression Maternal Grandmother   ? Brain cancer Maternal Grandmother   ? Cancer Other 55  ?     breast  ? Anxiety disorder Other   ? Cancer Other 55  ?     breast  ? Cancer Other 55  ?     breast  ? Cancer Other 55  ?     breast  ? Cancer Other 60  ?  breast  ? Cancer Other 60  ?     breast  ? Cancer Other 60  ?     breast  ? Ovarian cancer Neg Hx   ? ? ?Social History: Reviewed social history from progress note on 10/25/2017. ?Social History  ? ?Socioeconomic History  ? Marital status: Divorced  ?  Spouse name: Not on file  ? Number of children: 0  ? Years of education: 40  ? Highest education level: High school graduate  ?Occupational History  ? 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 Smoking status: Never  ? Smokeless tobacco: Never  ?Vaping Use  ? Vaping Use: Never used  ?Substance and Sexual Activity  ? Alcohol use: Not Currently  ?  Comment: occasional- 1 drink per month  ? Drug use: Yes  ?  Types: Marijuana  ?  Comment: last used 3 weeks ago  ? Sexual activity: Yes  ?  Partners: Male  ?  Birth control/protection:  I.U.D.  ?  Comment: Mirena  ?Other Topics Concern  ? Not on file  ?Social History Narrative  ? Not on file  ? ?Social Determinants of Health  ? ?Financial Resource Strain: Not on file  ?Food Insecurity: Not on file  ?Transportation Needs: Not on file  ?Physical Activity: Not on file  ?Stress: Not on file  ?Social Connections: Not on file  ? ? ?Allergies: No Known Allergies ? ?Metabolic Disorder Labs: ?Lab Results  ?Component Value Date  ? HGBA1C 5.7 (H) 04/21/2021  ? ?No results found for: PROLACTIN ?Lab Results  ?Component Value Date  ? CHOL 163 04/21/2021  ? TRIG 89 04/21/2021  ? HDL 46 04/21/2021  ? CHOLHDL 3.5 04/21/2021  ? LDLCALC 100 (H) 04/21/2021  ? [place] 86 08/29/2017  ? ?Lab Results  ?Component Value Date  ? TSH 1.514 07/21/2021  ? TSH 1.100 11/30/2018  ? ? ?Therapeutic Level Labs: ?No results found for: LITHIUM ?No results found for: VALPROATE ?No components found for:  CBMZ ? ?Current Medications: ?Current Outpatient Medications  ?Medication Sig Dispense Refill  ? cariprazine (VRAYLAR) 3 MG capsule Take 1 capsule (3 mg total) by mouth daily. 30 capsule 1  ? hydrOXYzine (ATARAX) 10 MG tablet Take 1 tablet (10 mg total) by mouth 2 (two) times daily as needed for anxiety. For severe anxiety 60 tablet 1  ? lamoTRIgine (LAMICTAL) 25 MG tablet Take 1 tablet (25 mg total) by mouth 2 (two) times daily. 60 tablet 1  ? levonorgestrel (MIRENA) 20 MCG/24HR IUD 1 each by Intrauterine route once.    ? ?No current facility-administered medications for this visit.  ? ? ? ?Musculoskeletal: ?Strength & Muscle Tone: within normal limits ?Gait & Station: normal ?Patient leans: N/A ? ?Psychiatric Specialty Exam: ?Review of Systems  ?Psychiatric/Behavioral:  The patient is nervous/anxious.   ?All other systems reviewed and are negative.  ?Blood pressure 117/75, pulse (!) 56, temperature 98.3 ?F (36.8 ?C), temperature source Temporal, weight 162 lb 9.6 oz (73.8 kg), last menstrual period 07/14/2021.Body mass index is 28.8  kg/m?.  ?General Appearance: Casual  ?Eye Contact:  Fair  ?Speech:  Clear and Coherent  ?Volume:  Normal  ?Mood:  Anxious  ?Affect:  Congruent  ?Thought Process:  Goal Directed and Descriptions of Associations: Intact  ?Orientation:  Full (Time, Place, and Person)  ?Thought Content: Logical   ?Suicidal Thoughts:  No  ?Homicidal Thoughts:  No  ?Memory:  Immediate;   Fair ?Recent;   Fair ?  Remote;   Fair  ?Judgement:  Intact  ?Insight:  Fair  ?Psychomotor Activity:  Normal  ?Concentration:  Concentration: Fair and Attention Span: Fair  ?Recall:  Fair  ?Fund of Knowledge: Fair  ?Language: Fair  ?Akathisia:  No  ?Handed:  Right  ?AIMS (if indicated):  done  ?Assets:  Communication Skills ?Desire for Improvement ?Housing ?Social Support ?Talents/Skills ?Transportation  ?ADL's:  Intact  ?Cognition: WNL  ?Sleep:  Fair  ? ?Screenings: ?AIMS   ? ?[place] Office Visit from 08/07/2021 in [place] Office Visit from 07/28/2021 in [place]  ?AIMS Total Score 0 0  ? ?  ? ?GAD-7   ? ?[place] Office Visit from 08/07/2021 in [place] Office Visit from 07/21/2021 in [place]  ?Total GAD-7 Score 11 21  ? ?  ? ?PHQ2-9   ? ?[place] Office Visit from 08/07/2021 in [place] Office Visit from 07/28/2021 in [place] Office Visit from 07/21/2021 in [place] Office Visit from 04/21/2021 in [place] Visit from 04/17/2020 in [place]  ?PHQ-2 Total Score '3 4 6 6 3  ' ?PHQ-9 Total Score '13 17 20 27 10  ' ? ?  ? ?[place] Office Visit from 08/07/2021 in [place] Office Visit from 07/28/2021 in [place] Office Visit from 07/21/2021 in [place]  ?C-SSRS RISK CATEGORY Low Risk Low Risk Low  Risk  ? ?  ? ? ? ?Assessment and Plan: Letica Giaimo is a 31 year old Caucasian female who has a history of bipolar disorder, GAD was evaluated in office today.  Patient is currently improving although

## 2021-08-07 NOTE — Patient Instructions (Signed)
[facility] ?[email or web address] ?Counseling & mental health ?[address] [place], [place] [address]  ?([phone] ? ?[facility] ?Counseling & mental health ?[address] [place], [place] [address]  ?[phone] ?

## 2021-08-28 ENCOUNTER — Other Ambulatory Visit: Payer: Self-pay | Admitting: Psychiatry

## 2021-08-28 DIAGNOSIS — F3181 Bipolar II disorder: Secondary | ICD-10-CM

## 2021-09-04 ENCOUNTER — Encounter: Payer: Self-pay | Admitting: Family Medicine

## 2021-09-04 ENCOUNTER — Other Ambulatory Visit: Payer: Self-pay | Admitting: Physician Assistant

## 2021-09-04 DIAGNOSIS — F3132 Bipolar disorder, current episode depressed, moderate: Secondary | ICD-10-CM

## 2021-09-04 MED ORDER — LAMOTRIGINE 25 MG PO TABS: 25.0000 mg | ORAL_TABLET | Freq: Two times a day (BID) | ORAL | 0 refills | Status: DC

## 2021-09-08 ENCOUNTER — Telehealth: Payer: Self-pay | Admitting: Psychiatry

## 2021-09-08 NOTE — Telephone Encounter (Signed)
Returned call left on voice mail by patient requesting appointment with Dr. Elna Breslow earlier than current one scheduled for 09/18/2021. No answer. Left voicemail requesting call back. ?

## 2021-09-15 ENCOUNTER — Other Ambulatory Visit: Payer: Self-pay | Admitting: Psychiatry

## 2021-09-15 DIAGNOSIS — F3132 Bipolar disorder, current episode depressed, moderate: Secondary | ICD-10-CM

## 2021-09-15 NOTE — Progress Notes (Signed)
? ?PCP:  Erasmo DownerBacigalupo, Angela M, MD ? ? ?Chief Complaint  ?Patient presents with  ? Gynecologic Exam  ?  No concerns  ? ? ? ?HPI: ?     Ms. Suzanne Boyd is a 31 y.o. G0P0000 whose LMP was No LMP recorded. (Menstrual status: IUD)., presents today for her annual examination.  Her menses are random with IUD, lasting 2-3 days, light flow, rare BTB, mild dysmen, improved with NSAIDs.  ? ?Sex activity: single partner, contraception - IUD. Kyleena placed 07/28/17. Has some vaginal dryness and pain, lubricants with some improvement.  ?Last Pap: 10/30/19 Results were: no abnormalities /neg HPV DNA  ? ?Last mammogram: none ?There is a strong FH of breast cancer on her mat side in MGM and mat grt aunts. There is no FH of ovarian cancer. The patient does do self-breast exams. Pt is MyRisk neg 6/21; IBIS=19.4%/riskscore=22.2%. Has not had mammo or screen breast MRI.  ? ?Tobacco use: The patient denies current or previous tobacco use. ?Alcohol use: none ?No drug use.  ?Exercise: moderately active ? ?She does get adequate calcium but not Vitamin D in her diet. ? ?Patient Active Problem List  ? Diagnosis Date Noted  ? Bipolar 1 disorder, depressed, partial remission (HCC) 08/07/2021  ? At risk for prolonged QT interval syndrome 07/21/2021  ? RLS (restless legs syndrome) 07/21/2021  ? Long term current use of cannabis 07/21/2021  ? Pelvic pain 04/21/2021  ? 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  ? Passive suicidal ideations 04/21/2021  ? Bipolar 2 disorder, major depressive episode (HCC) 04/17/2020  ? GAD (generalized anxiety disorder) 04/17/2020  ? Insomnia 04/17/2020  ? Family history of breast cancer 10/30/2019  ? Family history of diabetes mellitus 08/29/2017  ? Overweight 07/04/2017  ? ? ?Past Surgical History:  ?Procedure Laterality Date  ? INTRAUTERINE DEVICE (IUD) INSERTION    ? ? ?Family History  ?Problem Relation Age of Onset  ? Hyperlipidemia Mother   ? Hypertension Mother   ? Varicose Veins Mother   ? Hypertension Father   ?  Thyroid disease Father   ? Healthy Brother   ? Bipolar disorder Maternal Aunt   ? Cancer - Colon Maternal Uncle   ? Thyroid disease Maternal Grandfather   ? Cancer Maternal Grandmother 50  ?     Breast  ? Depression Maternal Grandmother   ? Brain cancer Maternal Grandmother   ? Cancer Other 55  ?     breast  ? Anxiety disorder Other   ? Cancer Other 55  ?     breast  ? Cancer Other 55  ?     breast  ? Cancer Other 55  ?     breast  ? Cancer Other 60  ?     breast  ? Cancer Other 60  ?     breast  ? Cancer Other 60  ?     breast  ? Ovarian cancer Neg Hx   ? ? ?Social History  ? ?Socioeconomic History  ? Marital status: Divorced  ?  Spouse name: Not on file  ? Number of children: 0  ? Years of education: 1812  ? Highest education level: High school graduate  ?Occupational History  ? Occupation: Air cabin crewmanager retail  ?  Comment: full time  ?Tobacco Use  ? Smoking status: Never  ? Smokeless tobacco: Never  ?Vaping Use  ? Vaping Use: Never used  ?Substance and Sexual Activity  ? Alcohol use: Not Currently  ?  Comment: occasional- 1  drink per month  ? Drug use: Yes  ?  Types: Marijuana  ?  Comment: last used 3 weeks ago  ? Sexual activity: Yes  ?  Partners: Male  ?  Birth control/protection: I.U.D.  ?  Comment: Mirena  ?Other Topics Concern  ? Not on file  ?Social History Narrative  ? Not on file  ? ?Social Determinants of Health  ? ?Financial Resource Strain: Not on file  ?Food Insecurity: Not on file  ?Transportation Needs: Not on file  ?Physical Activity: Not on file  ?Stress: Not on file  ?Social Connections: Not on file  ?Intimate Partner Violence: Not on file  ? ? ? ?Current Outpatient Medications:  ?  hydrOXYzine (ATARAX) 10 MG tablet, Take 1 tablet (10 mg total) by mouth 2 (two) times daily as needed for anxiety. For severe anxiety, Disp: 60 tablet, Rfl: 1 ?  lamoTRIgine (LAMICTAL) 25 MG tablet, Take 1 tablet (25 mg total) by mouth 2 (two) times daily., Disp: 180 tablet, Rfl: 0 ?  levonorgestrel (MIRENA) 20 MCG/24HR IUD,  1 each by Intrauterine route once., Disp: , Rfl:  ?  VRAYLAR 3 MG capsule, TAKE 1 CAPSULE(3 MG) BY MOUTH DAILY, Disp: 30 capsule, Rfl: 1 ? ? ? ? ?ROS: ? ?Review of Systems  ?Constitutional:  Positive for fatigue. Negative for fever and unexpected weight change.  ?Respiratory:  Negative for cough, shortness of breath and wheezing.   ?Cardiovascular:  Negative for chest pain, palpitations and leg swelling.  ?Gastrointestinal:  Negative for blood in stool, constipation, diarrhea, nausea and vomiting.  ?Endocrine: Negative for cold intolerance, heat intolerance and polyuria.  ?Genitourinary:  Positive for dyspareunia. Negative for dysuria, flank pain, frequency, genital sores, hematuria, menstrual problem, pelvic pain, urgency, vaginal bleeding, vaginal discharge and vaginal pain.  ?Musculoskeletal:  Positive for arthralgias. Negative for back pain, joint swelling and myalgias.  ?Skin:  Negative for rash.  ?Neurological:  Negative for dizziness, syncope, light-headedness, numbness and headaches.  ?Hematological:  Negative for adenopathy.  ?Psychiatric/Behavioral:  Positive for agitation and dysphoric mood. Negative for confusion, sleep disturbance and suicidal ideas. The patient is not nervous/anxious.   ?BREAST: No symptoms ? ? ?Objective: ?BP 110/60   Ht 5\' 3"  (1.6 m)   Wt 170 lb (77.1 kg)   BMI 30.11 kg/m?  ? ? ?Physical Exam ?Constitutional:   ?   Appearance: She is well-developed.  ?Genitourinary:  ?   Vulva normal.  ?   Right Labia: No rash, tenderness or lesions. ?   Left Labia: No tenderness, lesions or rash. ?   No vaginal discharge, erythema or tenderness.  ? ?   Right Adnexa: not tender and no mass present. ?   Left Adnexa: not tender and no mass present. ?   No cervical friability or polyp.  ?   IUD strings visualized.  ?   Uterus is not enlarged or tender.  ?Breasts: ?   Right: No mass, nipple discharge, skin change or tenderness.  ?   Left: No mass, nipple discharge, skin change or tenderness.  ?Neck:   ?   Thyroid: No thyromegaly.  ?Cardiovascular:  ?   Rate and Rhythm: Normal rate and regular rhythm.  ?   Heart sounds: Normal heart sounds. No murmur heard. ?Pulmonary:  ?   Effort: Pulmonary effort is normal.  ?   Breath sounds: Normal breath sounds.  ?Abdominal:  ?   Palpations: Abdomen is soft.  ?   Tenderness: There is no abdominal tenderness. There is no  guarding or rebound.  ?Musculoskeletal:     ?   General: Normal range of motion.  ?   Cervical back: Normal range of motion.  ?Lymphadenopathy:  ?   Cervical: No cervical adenopathy.  ?Neurological:  ?   General: No focal deficit present.  ?   Mental Status: She is alert and oriented to person, place, and time.  ?   Cranial Nerves: No cranial nerve deficit.  ?Skin: ?   General: Skin is warm and dry.  ?Psychiatric:     ?   Mood and Affect: Mood normal.     ?   Behavior: Behavior normal.     ?   Thought Content: Thought content normal.     ?   Judgment: Judgment normal.  ?Vitals reviewed.  ? ? ?Assessment/Plan: ?Encounter for annual routine gynecological examination ? ?Encounter for routine checking of intrauterine contraceptive device (IUD)--good till 3/24. Pt to f/u for removal and either replacement or different BC option.  ? ?Encounter for screening mammogram for malignant neoplasm of breast - Plan: MM 3D SCREEN BREAST BILATERAL; pt to schedule mammo ? ?Family history of breast cancer - Plan: MM 3D SCREEN BREAST BILATERAL; pt is MyRisk neg ? ?Increased risk of breast cancer - Plan: MM 3D SCREEN BREAST BILATERAL; pt aware of recommendations of monthly SBE, yearly CBE and mammos and scr breast MRI. Add Vit D 3. Do mammo now, can do MRI prn if interested ? ?          ?GYN counsel breast self exam, mammography screening, adequate intake of calcium and vitamin D, diet and exercise ? ? ?  F/U ? Return in about 1 year (around 09/17/2022). ? ?Sway Guttierrez B. Valentine Kuechle, PA-C ?09/16/2021 ?5:09 PM ?

## 2021-09-16 ENCOUNTER — Ambulatory Visit (INDEPENDENT_AMBULATORY_CARE_PROVIDER_SITE_OTHER): Payer: 59 | Admitting: Obstetrics and Gynecology

## 2021-09-16 ENCOUNTER — Encounter: Payer: Self-pay | Admitting: Obstetrics and Gynecology

## 2021-09-16 VITALS — BP 110/60 | Ht 63.0 in | Wt 170.0 lb

## 2021-09-16 DIAGNOSIS — Z1231 Encounter for screening mammogram for malignant neoplasm of breast: Secondary | ICD-10-CM | POA: Diagnosis not present

## 2021-09-16 DIAGNOSIS — Z30431 Encounter for routine checking of intrauterine contraceptive device: Secondary | ICD-10-CM

## 2021-09-16 DIAGNOSIS — Z01419 Encounter for gynecological examination (general) (routine) without abnormal findings: Secondary | ICD-10-CM | POA: Diagnosis not present

## 2021-09-16 DIAGNOSIS — Z803 Family history of malignant neoplasm of breast: Secondary | ICD-10-CM

## 2021-09-16 DIAGNOSIS — Z9189 Other specified personal risk factors, not elsewhere classified: Secondary | ICD-10-CM

## 2021-09-16 NOTE — Patient Instructions (Addendum)
I value your feedback and you entrusting us with your care. If you get a [facility] patient survey, I would appreciate you taking the time to let us know about your experience today. Thank you!  [facility]: [phone]   [facility]: [phone]    

## 2021-09-17 ENCOUNTER — Ambulatory Visit: Payer: 59 | Admitting: Obstetrics and Gynecology

## 2021-09-18 ENCOUNTER — Other Ambulatory Visit: Payer: Self-pay | Admitting: Psychiatry

## 2021-09-18 ENCOUNTER — Encounter: Payer: Self-pay | Admitting: Psychiatry

## 2021-09-18 ENCOUNTER — Ambulatory Visit (INDEPENDENT_AMBULATORY_CARE_PROVIDER_SITE_OTHER): Payer: 59 | Admitting: Psychiatry

## 2021-09-18 VITALS — BP 102/69 | HR 64 | Temp 98.3°F | Wt 166.0 lb

## 2021-09-18 DIAGNOSIS — G2571 Drug induced akathisia: Secondary | ICD-10-CM | POA: Diagnosis not present

## 2021-09-18 DIAGNOSIS — F411 Generalized anxiety disorder: Secondary | ICD-10-CM

## 2021-09-18 DIAGNOSIS — F3132 Bipolar disorder, current episode depressed, moderate: Secondary | ICD-10-CM

## 2021-09-18 DIAGNOSIS — G2581 Restless legs syndrome: Secondary | ICD-10-CM | POA: Diagnosis not present

## 2021-09-18 MED ORDER — LAMOTRIGINE 25 MG PO TABS
25.0000 mg | ORAL_TABLET | ORAL | 1 refills | Status: DC
Start: 2021-09-18 — End: 2021-10-19

## 2021-09-18 MED ORDER — CARIPRAZINE HCL 1.5 MG PO CAPS: 1.5000 mg | ORAL_CAPSULE | Freq: Every day | ORAL | 1 refills | Status: DC

## 2021-09-18 MED ORDER — PROPRANOLOL HCL 10 MG PO TABS: 10.0000 mg | ORAL_TABLET | Freq: Three times a day (TID) | ORAL | 1 refills | Status: DC | PRN

## 2021-09-18 MED ORDER — TRAZODONE HCL 50 MG PO TABS
25.0000 mg | ORAL_TABLET | Freq: Every evening | ORAL | 1 refills | Status: DC | PRN
Start: 2021-09-18 — End: 2021-11-27

## 2021-09-18 NOTE — Patient Instructions (Signed)
Propranolol Tablets ?What is this medication? ?PROPRANOLOL (proe PRAN oh lole) treats many conditions such as high blood pressure, tremors, and a type of arrhythmia known as AFib (atrial fibrillation). It works by lowering your blood pressure and heart rate, making it easier for your heart to pump blood to the rest of your body. It may be used to prevent migraine headaches. It works by relaxing the blood vessels in the brain that cause migraines. It belongs to a group of medications called beta blockers. ?This medicine may be used for other purposes; ask your health care provider or pharmacist if you have questions. ?COMMON BRAND NAME(S): Inderal ?What should I tell my care team before I take this medication? ?They need to know if you have any of these conditions: ?Circulation problems or blood vessel disease ?Diabetes ?History of heart attack or heart disease, vasospastic angina ?Kidney disease ?Liver disease ?Lung or breathing disease, like asthma or emphysema ?Pheochromocytoma ?Slow heart rate ?Thyroid disease ?An unusual or allergic reaction to propranolol, other beta-blockers, medications, foods, dyes, or preservatives ?Pregnant or trying to get pregnant ?Breast-feeding ?How should I use this medication? ?Take this medication by mouth. Take it as directed on the prescription label at the same time every day. Keep taking it unless your care team tells you to stop. ?Talk to your care team about the use of this medication in children. Special care may be needed. ?Overdosage: If you think you have taken too much of this medicine contact a poison control center or emergency room at once. ?NOTE: This medicine is only for you. Do not share this medicine with others. ?What if I miss a dose? ?If you miss a dose, take it as soon as you can. If it is almost time for your next dose, take only that dose. Do not take double or extra doses. ?What may interact with this medication? ?Do not take this medication with any of the  following: ?Feverfew ?Phenothiazines like chlorpromazine, mesoridazine, prochlorperazine, thioridazine ?This medication may also interact with the following: ?Aluminum hydroxide gel ?Antipyrine ?Antiviral medications for HIV or AIDS ?Barbiturates like phenobarbital ?Certain medications for blood pressure, heart disease, irregular heart beat ?Cimetidine ?Ciprofloxacin ?Diazepam ?Fluconazole ?Haloperidol ?Isoniazid ?Medications for cholesterol like cholestyramine or colestipol ?Medications for mental depression ?Medications for migraine headache like almotriptan, eletriptan, frovatriptan, naratriptan, rizatriptan, sumatriptan, zolmitriptan ?NSAIDs, medications for pain and inflammation, like ibuprofen or naproxen ?Phenytoin ?Rifampin ?Teniposide ?Theophylline ?Thyroid medications ?Tolbutamide ?Warfarin ?Zileuton ?This list may not describe all possible interactions. Give your health care provider a list of all the medicines, herbs, non-prescription drugs, or dietary supplements you use. Also tell them if you smoke, drink alcohol, or use illegal drugs. Some items may interact with your medicine. ?What should I watch for while using this medication? ?Visit your care team for regular checks on your progress. Check your blood pressure as directed. Ask your care team what your blood pressure should be. Also, find out when you should contact him or her. ?Do not treat yourself for coughs, colds, or pain while you are using this medication without asking your care team for advice. Some medications may increase your blood pressure. ?You may get drowsy or dizzy. Do not drive, use machinery, or do anything that needs mental alertness until you know how this medication affects you. Do not stand up or sit up quickly, especially if you are an older patient. This reduces the risk of dizzy or fainting spells. Alcohol may interfere with the effect of this medication. Avoid alcoholic drinks. ?  This medication may increase blood sugar.  Ask your care team if changes in diet or medications are needed if you have diabetes. ?What side effects may I notice from receiving this medication? ?Side effects that you should report to your care team as soon as possible: ?Allergic reactions--skin rash, itching, hives, swelling of the face, lips, tongue, or throat ?Heart failure--shortness of breath, swelling of the ankles, feet, or hands, sudden weight gain, unusual weakness or fatigue ?Low blood pressure--dizziness, feeling faint or lightheaded, blurry vision ?Raynaud's--cool, numb, or painful fingers or toes that may change color from pale, to blue, to red ?Redness, blistering, peeling, or loosening of the skin, including inside the mouth ?Slow heartbeat--dizziness, feeling faint or lightheaded, confusion, trouble breathing, unusual weakness or fatigue ?Worsening mood, feelings of depression ?Side effects that usually do not require medical attention (report to your care team if they continue or are bothersome): ?Change in sex drive or performance ?Diarrhea ?Dizziness ?Fatigue ?Headache ?This list may not describe all possible side effects. Call your doctor for medical advice about side effects. You may report side effects to FDA at 1-800-FDA-1088. ?Where should I keep my medication? ?Keep out of the reach of children and pets. ?Store at room temperature between 20 and 25 degrees C (68 and 77 degrees F). Protect from light. Throw away any unused medication after the expiration date. ?NOTE: This sheet is a summary. It may not cover all possible information. If you have questions about this medicine, talk to your doctor, pharmacist, or health care provider. ?? 2023 Elsevier/Gold Standard (2021-04-03 00:00:00) ?Trazodone Tablets ?What is this medication? ?TRAZODONE (TRAZ oh done) treats depression. It increases the amount of serotonin in the brain, a hormone that helps regulate mood. ?This medicine may be used for other purposes; ask your health care provider or  pharmacist if you have questions. ?COMMON BRAND NAME(S): Desyrel ?What should I tell my care team before I take this medication? ?They need to know if you have any of these conditions: ?Attempted suicide or thinking about it ?Bipolar disorder ?Bleeding problems ?Glaucoma ?Heart disease, or previous heart attack ?Irregular heart beat ?Kidney or liver disease ?Low levels of sodium in the blood ?An unusual or allergic reaction to trazodone, other medications, foods, dyes or preservatives ?Pregnant or trying to get pregnant ?Breast-feeding ?How should I use this medication? ?Take this medication by mouth with a glass of water. Follow the directions on the prescription label. Take this medication shortly after a meal or a light snack. Take your medication at regular intervals. Do not take your medication more often than directed. Do not stop taking this medication suddenly except upon the advice of your care team. Stopping this medication too quickly may cause serious side effects or your condition may worsen. ?A special MedGuide will be given to you by the pharmacist with each prescription and refill. Be sure to read this information carefully each time. ?Talk to your care team regarding the use of this medication in children. Special care may be needed. ?Overdosage: If you think you have taken too much of this medicine contact a poison control center or emergency room at once. ?NOTE: This medicine is only for you. Do not share this medicine with others. ?What if I miss a dose? ?If you miss a dose, take it as soon as you can. If it is almost time for your next dose, take only that dose. Do not take double or extra doses. ?What may interact with this medication? ?Do not take this medication  with any of the following: ?Certain medications for fungal infections like fluconazole, itraconazole, ketoconazole, posaconazole, voriconazole ?Cisapride ?Dronedarone ?Linezolid ?MAOIs like Carbex, Eldepryl, Marplan, Nardil, and  Parnate ?Mesoridazine ?Methylene blue (injected into a vein) ?Pimozide ?Saquinavir ?Thioridazine ?This medication may also interact with the following: ?Alcohol ?Antiviral medications for HIV or AIDS ?Aspi

## 2021-09-18 NOTE — Progress Notes (Signed)
This note is not being shared with the patient for the following reason: ?To prevent harm (release of this note would result in harm to the life or physical safety of the patient or another). ? ?[place] MD OP Progress Note ? ?09/18/2021 12:35 PM ?Suzanne Boyd  ?MRN:  [id number] ? ?Chief Complaint:  ?Chief Complaint  ?Patient presents with  ? Follow-up: 31 year old Caucasian female with history of bipolar disorder, anxiety disorder, sleep problems, presented with worsening anxiety, adverse side effects to the Vraylar.  ? ?HPI: Suzanne Boyd is a 31 year old Caucasian female, employed, divorced, lives in [place], has a history of bipolar disorder, GAD, insomnia, restless legs syndrome was evaluated in office today. ? ?Patient today reports she has noticed worsening mood symptoms since the past few weeks.  She reports she does have job related stressors.  She is currently working in another department, something that she is not used to.  She reports that this triggered her mood symptoms, anxiety.  Patient reports she has had sadness, low motivation, crying spells.  Patient also reports passive suicidal thoughts-couple of times a week or so.  Patient reports the last time she had it was yesterday, had thoughts of not wanting to be here however she did not come up with plan.  Currently denies any suicidality.  Reports she usually is able to cry for a few minutes and that helps her to feel better.  She does have her father and her sister who are supportive and she contacts them when she feels like talking to someone. ? ?Patient reports sleep as restless.  She reports she has difficulty falling asleep and staying asleep.  Patient likely with restless leg symptoms as well. ? ?Patient also reports she has this urge to keep moving and feels extremely restless and fidgety since being on the higher dosage of vraylar. ? ?Patient does have upcoming therapy session scheduled, agreeable to keep it. ? ? ? ? ? ?Visit Diagnosis:  ?   ICD-10-CM   ?1. Bipolar 1 disorder, depressed, moderate (HCC)  F31.32 lamoTRIgine (LAMICTAL) 25 MG tablet  ?  cariprazine (VRAYLAR) 1.5 MG capsule  ?  propranolol (INDERAL) 10 MG tablet  ?  traZODone (DESYREL) 50 MG tablet  ?  ?2. GAD (generalized anxiety disorder)  F41.1 cariprazine (VRAYLAR) 1.5 MG capsule  ?  propranolol (INDERAL) 10 MG tablet  ?  traZODone (DESYREL) 50 MG tablet  ?  ?3. RLS (restless legs syndrome)  G25.81   ?  ?4. Akathisia  G25.71   ?  ? ? ?Past Psychiatric History: Reviewed past psychiatric history from progress note on 10/25/2017.  Past trials of Paxil, Lexapro, Abilify, risperidone, Wellbutrin ? ?Past Medical History:  ?Past Medical History:  ?Diagnosis Date  ? Anxiety   ? BRCA negative 10/2019  ? MyRIsk neg  ? Depression   ? Family history of breast cancer   ? Increased risk of breast cancer 10/2019  ? IBIS=19.4%/riskscore=22.2%  ? LGSIL on Pap smear of cervix   ?  ?Past Surgical History:  ?Procedure Laterality Date  ? INTRAUTERINE DEVICE (IUD) INSERTION    ? ? ?Family Psychiatric History: Reviewed family psychiatric history from progress note on 10/25/2017. ? ?Family History:  ?Family History  ?Problem Relation Age of Onset  ? Hyperlipidemia Mother   ? Hypertension Mother   ? Varicose Veins Mother   ? Hypertension Father   ? Thyroid disease Father   ? Healthy Brother   ? Bipolar disorder Maternal Aunt   ? Cancer - Colon  Maternal Uncle   ? Thyroid disease Maternal Grandfather   ? Cancer Maternal Grandmother 68  ?     Breast  ? Depression Maternal Grandmother   ? Brain cancer Maternal Grandmother   ? Cancer Other 55  ?     breast  ? Anxiety disorder Other   ? Cancer Other 53  ?     breast  ? Cancer Other 55  ?     breast  ? Cancer Other 54  ?     breast  ? Cancer Other 60  ?     breast  ? Cancer Other 60  ?     breast  ? Cancer Other 60  ?     breast  ? Ovarian cancer Neg Hx   ? ? ?Social History: Reviewed social history from progress note on 10/25/2017. ?Social History  ? ?Socioeconomic  History  ? Marital status: Divorced  ?  Spouse name: Not on file  ? Number of children: 0  ? Years of education: 68  ? Highest education level: High school graduate  ?Occupational History  ? 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 Smoking status: Never  ? Smokeless tobacco: Never  ?Vaping Use  ? Vaping Use: Never used  ?Substance and Sexual Activity  ? Alcohol use: Not Currently  ?  Comment: occasional- 1 drink per month  ? Drug use: Yes  ?  Types: Marijuana  ?  Comment: last used 3 weeks ago  ? Sexual activity: Yes  ?  Partners: Male  ?  Birth control/protection: I.U.D.  ?  Comment: Mirena  ?Other Topics Concern  ? Not on file  ?Social History Narrative  ? Not on file  ? ?Social Determinants of Health  ? ?Financial Resource Strain: Not on file  ?Food Insecurity: Not on file  ?Transportation Needs: Not on file  ?Physical Activity: Not on file  ?Stress: Not on file  ?Social Connections: Not on file  ? ? ?Allergies: No Known Allergies ? ?Metabolic Disorder Labs: ?Lab Results  ?Component Value Date  ? HGBA1C 5.7 (H) 04/21/2021  ? ?No results found for: PROLACTIN ?Lab Results  ?Component Value Date  ? CHOL 163 04/21/2021  ? TRIG 89 04/21/2021  ? HDL 46 04/21/2021  ? CHOLHDL 3.5 04/21/2021  ? LDLCALC 100 (H) 04/21/2021  ? [place] 86 08/29/2017  ? ?Lab Results  ?Component Value Date  ? TSH 1.514 07/21/2021  ? TSH 1.100 11/30/2018  ? ? ?Therapeutic Level Labs: ?No results found for: LITHIUM ?No results found for: VALPROATE ?No components found for:  CBMZ ? ?Current Medications: ?Current Outpatient Medications  ?Medication Sig Dispense Refill  ? cariprazine (VRAYLAR) 1.5 MG capsule Take 1 capsule (1.5 mg total) by mouth daily. 30 capsule 1  ? hydrOXYzine (ATARAX) 10 MG tablet Take 1 tablet (10 mg total) by mouth 2 (two) times daily as needed for anxiety. For severe anxiety 60 tablet 1  ? lamoTRIgine (LAMICTAL) 25 MG tablet Take 1 tablet (25 mg total) by mouth 2 (two) times daily. 180 tablet 0  ?  lamoTRIgine (LAMICTAL) 25 MG tablet Take 1-2 tablets (25-50 mg total) by mouth as directed. Take 1 tablet daily AM and 2 tablets daily PM 90 tablet 1  ? levonorgestrel (MIRENA) 20 MCG/24HR IUD 1 each by Intrauterine route once.    ? propranolol (INDERAL) 10 MG tablet Take 1 tablet (10 mg total) by mouth 3 (three) times daily as needed. FOR RESTLESNESS,  ANXIETY 90 tablet 1  ? traZODone (DESYREL) 50 MG tablet Take 0.5-1 tablets (25-50 mg total) by mouth at bedtime as needed for sleep. 30 tablet 1  ? ?No current facility-administered medications for this visit.  ? ? ? ?Musculoskeletal: ?Strength & Muscle Tone: within normal limits ?Gait & Station: normal ?Patient leans: N/A ? ?Psychiatric Specialty Exam: ?Review of Systems  ?Psychiatric/Behavioral:  Positive for decreased concentration, dysphoric mood, hallucinations, sleep disturbance and suicidal ideas. The patient is nervous/anxious.   ?All other systems reviewed and are negative.  ?Blood pressure 102/69, pulse 64, temperature 98.3 ?F (36.8 ?C), temperature source Temporal, weight 166 lb (75.3 kg).Body mass index is 29.41 kg/m?.  ?General Appearance: Casual  ?Eye Contact:  Fair  ?Speech:  Clear and Coherent  ?Volume:  Normal  ?Mood:  Anxious and Depressed  ?Affect:  Congruent  ?Thought Process:  Goal Directed and Descriptions of Associations: Intact  ?Orientation:  Full (Time, Place, and Person)  ?Thought Content: Logical   ?Suicidal Thoughts:  Yes.  without intent/plan has had suicidal thoughts in the past week however currently denies it.  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  ?Homicidal Thoughts:  No  ?Memory:  Immediate;   Fair ?Recent;   Fair ?Remote;   Fair  ?Judgement:  Fair  ?Insight:  Fair  ?Psychomotor Activity:  Increased and Restlessness  ?Concentration:  Concentration: Fair  ?Recall:  Fair  ?Fund of Knowledge: Fair  ?Language: Fair  ?Akathisia:  No  ?Handed:  Right  ?AIMS (if indicated): done  ?Assets:  Communication Skills ?Desire for  Improvement ?Housing ?Social Support  ?ADL's:  Intact  ?Cognition: WNL  ?Sleep:  Poor  ? ?Screenings: ?AIMS   ? ?[place] Office Visit from 09/18/2021 in [facility] Office Visit from 3/24/

## 2021-09-23 ENCOUNTER — Ambulatory Visit (INDEPENDENT_AMBULATORY_CARE_PROVIDER_SITE_OTHER): Payer: 59 | Admitting: Licensed Clinical Social Worker

## 2021-09-23 DIAGNOSIS — F411 Generalized anxiety disorder: Secondary | ICD-10-CM | POA: Diagnosis not present

## 2021-09-23 DIAGNOSIS — F3132 Bipolar disorder, current episode depressed, moderate: Secondary | ICD-10-CM | POA: Diagnosis not present
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
## 2021-09-23 NOTE — Plan of Care (Signed)
Developed tx plan based on pt self reported input  

## 2021-10-02 ENCOUNTER — Ambulatory Visit: Payer: 59 | Admitting: Psychiatry

## 2021-10-15 ENCOUNTER — Other Ambulatory Visit: Payer: Self-pay | Admitting: Psychiatry

## 2021-10-15 DIAGNOSIS — F411 Generalized anxiety disorder: Secondary | ICD-10-CM

## 2021-10-19 ENCOUNTER — Telehealth: Payer: Self-pay

## 2021-10-19 DIAGNOSIS — F3132 Bipolar disorder, current episode depressed, moderate: Secondary | ICD-10-CM

## 2021-10-19 MED ORDER — LAMOTRIGINE 25 MG PO TABS: 25.0000 mg | ORAL_TABLET | ORAL | 0 refills | Status: DC

## 2021-10-19 NOTE — Telephone Encounter (Signed)
received fax requesting that per the insurance the insurance will only cover a 90 day supply of the lamtrigine 25mg 

## 2021-10-19 NOTE — Telephone Encounter (Signed)
I have sent Lamictal 90 days supply to pharmacy.

## 2021-11-05 ENCOUNTER — Ambulatory Visit (HOSPITAL_COMMUNITY): Payer: 59 | Admitting: Licensed Clinical Social Worker

## 2021-11-05 ENCOUNTER — Ambulatory Visit: Payer: 59 | Admitting: Psychiatry

## 2021-11-05 ENCOUNTER — Telehealth: Payer: Self-pay | Admitting: Psychiatry

## 2021-11-05 NOTE — Telephone Encounter (Signed)
Noted! Thank you

## 2021-11-05 NOTE — Telephone Encounter (Signed)
Patient was no--show for appointment today. Called and left voicemail at provider's request to request a call back to re-schedule or be dismissed from the practice. She returned call stating she forgot about the appointment. She has been scheduled for 11/27/2021.

## 2021-11-11 ENCOUNTER — Ambulatory Visit (HOSPITAL_COMMUNITY): Payer: 59 | Admitting: Licensed Clinical Social Worker

## 2021-11-27 ENCOUNTER — Ambulatory Visit (INDEPENDENT_AMBULATORY_CARE_PROVIDER_SITE_OTHER): Payer: 59 | Admitting: Psychiatry

## 2021-11-27 ENCOUNTER — Encounter: Payer: Self-pay | Admitting: Psychiatry

## 2021-11-27 VITALS — BP 119/70 | HR 65 | Temp 98.3°F | Wt 174.2 lb

## 2021-11-27 DIAGNOSIS — F3132 Bipolar disorder, current episode depressed, moderate: Secondary | ICD-10-CM | POA: Insufficient documentation

## 2021-11-27 DIAGNOSIS — R635 Abnormal weight gain: Secondary | ICD-10-CM

## 2021-11-27 DIAGNOSIS — F411 Generalized anxiety disorder: Secondary | ICD-10-CM | POA: Diagnosis not present

## 2021-11-27 DIAGNOSIS — F3175 Bipolar disorder, in partial remission, most recent episode depressed: Secondary | ICD-10-CM

## 2021-11-27 MED ORDER — TRAZODONE HCL 50 MG PO TABS: 75.0000 mg | ORAL_TABLET | Freq: Every evening | ORAL | 1 refills | Status: DC | PRN

## 2021-11-27 MED ORDER — CARIPRAZINE HCL 1.5 MG PO CAPS: 1.5000 mg | ORAL_CAPSULE | Freq: Every day | ORAL | 1 refills | Status: DC

## 2021-11-27 NOTE — Progress Notes (Signed)
[facility] MD OP Progress Note  11/27/2021 12:55 PM Suzanne Boyd  MRN:  [id number]  Chief Complaint:  Chief Complaint  Patient presents with   Follow-up: 31 year old Caucasian female with history of bipolar disorder, anxiety disorder, sleep problems, presented for medication management.   HPI: Suzanne Boyd is a 31 year old Caucasian female, employed, divorced, lives in [place], has a history of bipolar disorder, GAD, insomnia, restless leg syndrome was evaluated in the office today.  Patient today reports she is currently improving with regards to her depression on the Vraylar and lamotrigine.  Since being on the lower dosage of Vraylar she has not had any side effects of akathisia.  Patient however reports she continues to feel anxious, has a lot of psychosocial stresses including change of job position recently.  She reports she currently works at a new job position and has been doing that since the past 1 to 2 weeks.  She is still in training.  That can be anxiety provoking although this is better than her previous job.  She also reports her current shift as during the day and she loves that.  Patient denies any suicidality.  Reports she does have thoughts about not wanting to be here at times however that has improved compared to how it was previously.  Patient reports sleep as restless.  The trazodone does help to some extent.  Agreeable to dosage increase.  Patient does report possible weight gain likely due to her psychotropics.  Does not have a good exercise routine.  Patient has been noncompliant with psychotherapy sessions, however agreeable to restart psychotherapy.  She however is concerned about the financial part of it.  Patient denies any other concerns today.  Visit Diagnosis:    ICD-10-CM   1. Bipolar 1 disorder, depressed, partial remission (HCC)  F31.75 traZODone (DESYREL) 50 MG tablet    cariprazine (VRAYLAR) 1.5 MG capsule    2. GAD (generalized anxiety disorder)   F41.1 traZODone (DESYREL) 50 MG tablet    cariprazine (VRAYLAR) 1.5 MG capsule    3. Weight gain  R63.5    unspecified      Past Psychiatric History: Reviewed past psychiatric history from progress note on 10/25/2017.  Past trials of Paxil, Lexapro, Abilify, risperidone, Wellbutrin.  Past Medical History:  Past Medical History:  Diagnosis Date   Anxiety    BRCA negative 10/2019   MyRIsk neg   Depression    Family history of breast cancer    Increased risk of breast cancer 10/2019   IBIS=19.4%/riskscore=22.2%   LGSIL on Pap smear of cervix     Past Surgical History:  Procedure Laterality Date   INTRAUTERINE DEVICE (IUD) INSERTION      Family Psychiatric History: Reviewed family psychiatric history from progress note on 10/25/2017.  Family History:  Family History  Problem Relation Age of Onset   Hyperlipidemia Mother    Hypertension Mother    Varicose Veins Mother    Hypertension Father    Thyroid disease Father    Healthy Brother    Bipolar disorder Maternal Aunt    Cancer - Colon Maternal Uncle    Thyroid disease Maternal Grandfather    Cancer Maternal Grandmother 66       Breast   Depression Maternal Grandmother    Brain cancer Maternal Grandmother    Cancer Other 75       breast   Anxiety disorder Other    Cancer Other 55       breast   Cancer Other 86  breast   Cancer Other 55       breast   Cancer Other 60       breast   Cancer Other 60       breast   Cancer Other 60       breast   Ovarian cancer Neg Hx     Social History: Reviewed social history from progress note on 10/25/2017. Social History   Socioeconomic History   Marital status: Divorced    Spouse name: Not on file   Number of children: 0   Years of education: 12   Highest education level: High school graduate  Occupational History   Occupation: Biochemist, clinical    Comment: full time  Tobacco Use   Smoking status: Never   Smokeless tobacco: Never  Vaping Use   Vaping Use:  Never used  Substance and Sexual Activity   Alcohol use: Not Currently    Comment: occasional- 1 drink per month   Drug use: Yes    Types: Marijuana    Comment: last used 3 weeks ago   Sexual activity: Yes    Partners: Male    Birth control/protection: I.U.D.    Comment: Mirena  Other Topics Concern   Not on file  Social History Narrative   Not on file   Social Determinants of Health   Financial Resource Strain: Low Risk  (10/25/2017)   Overall Financial Resource Strain (CARDIA)    Difficulty of Paying Living Expenses: Not hard at all  Food Insecurity: No Food Insecurity (10/25/2017)   Hunger Vital Sign    Worried About Running Out of Food in the Last Year: Never true    Ran Out of Food in the Last Year: Never true  Transportation Needs: No Transportation Needs (10/25/2017)   PRAPARE - Hydrologist (Medical): No    Lack of Transportation (Non-Medical): No  Physical Activity: Inactive (07/04/2017)   Exercise Vital Sign    Days of Exercise per Week: 0 days    Minutes of Exercise per Session: 0 min  Stress: Stress Concern Present (07/04/2017)   [place]    Feeling of Stress : To some extent  Social Connections: Moderately Isolated (10/25/2017)   Social Connection and Isolation Panel [NHANES]    Frequency of Communication with Friends and Family: Once a week    Frequency of Social Gatherings with Friends and Family: Once a week    Attends Religious Services: Never    Marine scientist or Organizations: No    Attends Music therapist: Never    Marital Status: Married    Allergies: No Known Allergies  Metabolic Disorder Labs: Lab Results  Component Value Date   HGBA1C 5.7 (H) 04/21/2021   No results found for: "PROLACTIN" Lab Results  Component Value Date   CHOL 163 04/21/2021   TRIG 89 04/21/2021   HDL 46 04/21/2021   CHOLHDL 3.5 04/21/2021   [place] 100  (H) 04/21/2021   [place] 86 08/29/2017   Lab Results  Component Value Date   TSH 1.514 07/21/2021   TSH 1.100 11/30/2018    Therapeutic Level Labs: No results found for: "LITHIUM" No results found for: "VALPROATE" No results found for: "CBMZ"  Current Medications: Current Outpatient Medications  Medication Sig Dispense Refill   hydrOXYzine (ATARAX) 10 MG tablet TAKE 1 TABLET(10 MG) BY MOUTH TWICE DAILY AS NEEDED FOR ANXIETY OR SEVERE ANXIETY 60 tablet 1  lamoTRIgine (LAMICTAL) 25 MG tablet Take 1-2 tablets (25-50 mg total) by mouth as directed. Take 1 tablet daily AM and 2 tablets daily PM 270 tablet 0   levonorgestrel (MIRENA) 20 MCG/24HR IUD 1 each by Intrauterine route once.     propranolol (INDERAL) 10 MG tablet TAKE 1 TABLET BY MOUTH THREE TIMES DAILY AS NEEDED FOR RESTLESNESS AND ANXIETY 270 tablet 0   cariprazine (VRAYLAR) 1.5 MG capsule Take 1 capsule (1.5 mg total) by mouth daily. 30 capsule 1   traZODone (DESYREL) 50 MG tablet Take 1.5-2 tablets (75-100 mg total) by mouth at bedtime as needed for sleep. 60 tablet 1   No current facility-administered medications for this visit.     Musculoskeletal: Strength & Muscle Tone: within normal limits Gait & Station: normal Patient leans: N/A  Psychiatric Specialty Exam: Review of Systems  Psychiatric/Behavioral:  Positive for dysphoric mood and sleep disturbance. The patient is nervous/anxious.   All other systems reviewed and are negative.   Blood pressure 119/70, pulse 65, temperature 98.3 F (36.8 C), temperature source Temporal, weight 174 lb 3.2 oz (79 kg).Body mass index is 30.86 kg/m.  General Appearance: Casual  Eye Contact:  Fair  Speech:  Clear and Coherent  Volume:  Normal  Mood:  Anxious and Depressed  Affect:  Congruent  Thought Process:  Goal Directed and Descriptions of Associations: Intact  Orientation:  Full (Time, Place, and Person)  Thought Content: Logical   Suicidal Thoughts:  No  Homicidal  Thoughts:  No  Memory:  Immediate;   Fair Recent;   Fair Remote;   Fair  Judgement:  Fair  Insight:  Fair  Psychomotor Activity:  Normal  Concentration:  Concentration: Fair and Attention Span: Fair  Recall:  [employer] of Knowledge: Fair  Language: Fair  Akathisia:  No  Handed:  Right  AIMS (if indicated): done  Assets:  Communication Skills Desire for Improvement Housing Social Support  ADL's:  Intact  Cognition: WNL  Sleep:  Poor   Screenings: [place] Office Visit from 11/27/2021 in [place] [place] Office Visit from 09/18/2021 in [facility] Office Visit from 08/07/2021 in [facility] Office Visit from 07/28/2021 in [place] Total Score 0 0 0 [place] Visit from 11/27/2021 in [place] from 08/07/2021 in [place] Visit from 07/21/2021 in [place]  Total GAD-7 Score _0 PHQ2-9    [place] Visit from 11/27/2021 in [place] from 09/23/2021 in [facility] Visit from 09/18/2021 in [facility] Visit from 08/07/2021 in [facility] [facility] Visit from 07/28/2021 in [facility]  PHQ-2 Total Score _1 PHQ-9 Total Score _2 [place] Office Visit from 11/27/2021 in [facility] Counselor from 09/23/2021 in [facility] Visit from 09/18/2021 in [facility] Low Risk Error: Q3, 4, or 5 should not be populated when Q2 is No Low Risk        Assessment and Plan: Suzanne Boyd is a 31 year old  Caucasian female who has a history of bipolar disorder, GAD was evaluated in office today.  Patient with improvement in her depression symptoms however continues to have anxiety, sleep problems, will benefit from medication management and psychotherapy sessions.  Plan as noted below.  Plan Bipolar disorder type I depressed in partial remission Vraylar 1.5 mg p.o. daily-dose reduced due to side effects. Lamotrigine 75 mg p.o. daily divided dosage.  We will consider increasing it in future sessions. Increase trazodone to 75-100 mg p.o. nightly as needed for sleep  GAD-unstable Patient advised to have more frequent psychotherapy sessions.  She will make an appointment with Ms. Christina Hussami. Hydroxyzine 25 mg p.o. twice daily as needed for severe anxiety Patient also has propranolol 10 mg daily as needed for severe anxiety attacks-however has not been using it.  Weight gain-likely secondary to medications-unstable Patient with recent weight gain-likely due to her psychotropics.  Discussed lifestyle modification.  She does not have an exercise routine.  Agrees to start watching her diet and exercise more regularly.  Will continue to monitor.  Follow-up in clinic in 8 weeks or sooner if needed.  This note was generated in part or whole with voice recognition software. Voice recognition is usually quite accurate but there are transcription errors that can and very often do occur. I apologize for any typographical errors that were not detected and corrected.    Ursula Alert, MD 11/27/2021, 12:55 PM

## 2021-12-24 ENCOUNTER — Other Ambulatory Visit: Payer: Self-pay | Admitting: Psychiatry

## 2021-12-24 DIAGNOSIS — F411 Generalized anxiety disorder: Secondary | ICD-10-CM

## 2021-12-24 DIAGNOSIS — F3132 Bipolar disorder, current episode depressed, moderate: Secondary | ICD-10-CM

## 2022-01-04 ENCOUNTER — Ambulatory Visit (HOSPITAL_COMMUNITY): Payer: 59 | Admitting: Licensed Clinical Social Worker

## 2022-02-08 ENCOUNTER — Ambulatory Visit: Payer: 59 | Admitting: Psychiatry

## 2022-02-15 ENCOUNTER — Ambulatory Visit (HOSPITAL_COMMUNITY): Payer: 59 | Admitting: Licensed Clinical Social Worker

## 2022-02-17 ENCOUNTER — Ambulatory Visit (HOSPITAL_COMMUNITY): Payer: 59 | Admitting: Licensed Clinical Social Worker

## 2022-02-20 ENCOUNTER — Other Ambulatory Visit: Payer: Self-pay | Admitting: Psychiatry

## 2022-02-20 DIAGNOSIS — F411 Generalized anxiety disorder: Secondary | ICD-10-CM

## 2022-02-20 DIAGNOSIS — F3175 Bipolar disorder, in partial remission, most recent episode depressed: Secondary | ICD-10-CM

## 2022-03-29 ENCOUNTER — Ambulatory Visit (INDEPENDENT_AMBULATORY_CARE_PROVIDER_SITE_OTHER): Payer: 59 | Admitting: Psychiatry

## 2022-03-29 ENCOUNTER — Encounter: Payer: Self-pay | Admitting: Psychiatry

## 2022-03-29 ENCOUNTER — Ambulatory Visit (INDEPENDENT_AMBULATORY_CARE_PROVIDER_SITE_OTHER): Payer: 59 | Admitting: Licensed Clinical Social Worker

## 2022-03-29 ENCOUNTER — Other Ambulatory Visit
Admission: RE | Admit: 2022-03-29 | Discharge: 2022-03-29 | Disposition: A | Discharge status: Home / Self Care | Payer: 59 | Source: Ambulatory Visit | Attending: Psychiatry | Admitting: Psychiatry

## 2022-03-29 VITALS — BP 134/77 | HR 55 | Temp 97.8°F | Ht 63.0 in | Wt 185.4 lb

## 2022-03-29 DIAGNOSIS — Z79899 Other long term (current) drug therapy: Secondary | ICD-10-CM | POA: Insufficient documentation

## 2022-03-29 DIAGNOSIS — F3175 Bipolar disorder, in partial remission, most recent episode depressed: Secondary | ICD-10-CM | POA: Diagnosis not present

## 2022-03-29 DIAGNOSIS — F411 Generalized anxiety disorder: Secondary | ICD-10-CM

## 2022-03-29 MED ORDER — LAMOTRIGINE 25 MG PO TABS: 25.0000 mg | ORAL_TABLET | ORAL | 0 refills | Status: DC

## 2022-03-29 MED ORDER — FLUOXETINE HCL 20 MG PO CAPS: 20.0000 mg | ORAL_CAPSULE | Freq: Every day | ORAL | 1 refills | Status: DC

## 2022-03-29 NOTE — Progress Notes (Unsigned)
Virtual Visit via Video Note  I connected with Suzanne Boyd on 03/29/22 at 10:00 AM EST by a video enabled telemedicine application and verified that I am speaking with the correct person using two identifiers.  Location: Patient: home Provider: remote office [place], [place])   I discussed the limitations of evaluation and management by telemedicine and the availability of in person appointments. The patient expressed understanding and agreed to proceed.  I discussed the assessment and treatment plan with the patient. The patient was provided an opportunity to ask questions and all were answered. The patient agreed with the plan and demonstrated an understanding of the instructions.   The patient was advised to call back or seek an in-person evaluation if the symptoms worsen or if the condition fails to improve as anticipated.  I provided 45 minutes of non-face-to-face time during this encounter.   Keyonta [place] R Quinesha Selinger, LCSW   THERAPIST PROGRESS NOTE  Session Time: 10-1045a  Participation Level: Active  Behavioral Response: NeatAlertAnxious and Depressed  Type of Therapy: Individual Therapy  Treatment Goals addressed: ***  ProgressTowards Goals: Progressing  Interventions: CBT, Solution Focused, and Supportive  Summary: Suzanne Boyd is a 31 y.o. female who presents with ***.   Suicidal/Homicidal: Yes--thoughts with no plans or intent to follow through.   Therapist Response: Pt is continuing to apply interventions learned in session into daily life situations. Pt is currently on track to meet goals utilizing interventions mentioned above. Personal growth and progress noted. Treatment to continue as indicated.   Plan: Return again in 6 weeks.  Diagnosis: No diagnosis found.  Collaboration of Care: Other pt encouraged to continue care with psychiatrist of record, Dr. Jomarie Longs  Patient/Guardian was advised Release of Information must be obtained prior to any  record release in order to collaborate their care with an outside provider. Patient/Guardian was advised if they have not already done so to contact the registration department to sign all necessary forms in order for [place] to release information regarding their care.   Consent: Patient/Guardian gives verbal consent for treatment and assignment of benefits for services provided during this visit. Patient/Guardian expressed understanding and agreed to proceed.   Ernest Haber Khamil Lamica, LCSW 03/29/2022

## 2022-03-29 NOTE — Progress Notes (Unsigned)
[facility] MD OP Progress Note  03/29/2022 11:53 AM Suzanne Boyd  MRN:  [id number]  Chief Complaint:  Chief Complaint  Patient presents with   Follow-up   Medication Refill   Anxiety   Depression   HPI: Suzanne Boyd is a 31 year old Caucasian female, employed, divorced, lives in [place], has a history of bipolar disorder, GAD, insomnia, restless legs syndrome was evaluated in office today.  Patient today reports she is currently doing better although she continues to struggle with lack of motivation, low energy, sadness on and off.  Patient reports in spite of that she has been able to go to work and enjoy her work every day.  However when it comes to doing other activities like going to the gym or going out on a walk she has been unable to do so.  Patient reports sleep is currently better since she is currently working second shift and is able to sleep in a bit late in the morning.  That helps.  Does have trazodone available however has not been using it much.  Patient reports she does struggle with anxiety, feeling nervous, worrying about things, restless, becoming easily angry or irritable due to anxiety, ongoing since the past several weeks.  May have tried medications like Zoloft in the past however does not remember how it worked for her.  Agreeable to trial of Prozac.  Does report recurrent chronic thoughts of death however does not linger, denies plan.  It is getting better.  Patient plans to spend her Thanksgiving with her dad who is coming over.  Denies any homicidality or perceptual disturbances.  Denies any side effects to any of her medications.  Denies any other concerns today.  Visit Diagnosis:    ICD-10-CM   1. Bipolar 1 disorder, depressed, partial remission (HCC)  F31.75 lamoTRIgine (LAMICTAL) 25 MG tablet    2. GAD (generalized anxiety disorder)  F41.1 lamoTRIgine (LAMICTAL) 25 MG tablet    FLUoxetine (PROZAC) 20 MG capsule    3. High risk medication use  Z79.899  Prolactin      Past Psychiatric History: Reviewed past psychiatric history from progress note on 10/25/2017.  Past trials of Paxil, Lexapro, Abilify, risperidone, Wellbutrin.  Past Medical History:  Past Medical History:  Diagnosis Date   Anxiety    BRCA negative 10/2019   MyRIsk neg   Depression    Family history of breast cancer    Increased risk of breast cancer 10/2019   IBIS=19.4%/riskscore=22.2%   LGSIL on Pap smear of cervix     Past Surgical History:  Procedure Laterality Date   INTRAUTERINE DEVICE (IUD) INSERTION      Family Psychiatric History: Reviewed family psychiatric history from progress note on 10/25/2017.  Family History:  Family History  Problem Relation Age of Onset   Hyperlipidemia Mother    Hypertension Mother    Varicose Veins Mother    Hypertension Father    Thyroid disease Father    Healthy Brother    Bipolar disorder Maternal Aunt    Cancer - Colon Maternal Uncle    Thyroid disease Maternal Grandfather    Cancer Maternal Grandmother 19       Breast   Depression Maternal Grandmother    Brain cancer Maternal Grandmother    Cancer Other 93       breast   Anxiety disorder Other    Cancer Other 40       breast   Cancer Other 55       breast  Cancer Other 32       breast   Cancer Other 60       breast   Cancer Other 60       breast   Cancer Other 60       breast   Ovarian cancer Neg Hx     Social History: Reviewed social history from progress note on 10/25/2017. Social History   Socioeconomic History   Marital status: Divorced    Spouse name: Not on file   Number of children: 0   Years of education: 12   Highest education level: High school graduate  Occupational History   Occupation: Biochemist, clinical    Comment: full time  Tobacco Use   Smoking status: Never   Smokeless tobacco: Never  Vaping Use   Vaping Use: Never used  Substance and Sexual Activity   Alcohol use: Not Currently    Comment: occasional- 1 drink per month    Drug use: Yes    Types: Marijuana    Comment: last used 3 weeks ago   Sexual activity: Yes    Partners: Male    Birth control/protection: I.U.D.    Comment: Mirena  Other Topics Concern   Not on file  Social History Narrative   Not on file   Social Determinants of Health   Financial Resource Strain: Low Risk  (10/25/2017)   Overall Financial Resource Strain (CARDIA)    Difficulty of Paying Living Expenses: Not hard at all  Food Insecurity: No Food Insecurity (10/25/2017)   Hunger Vital Sign    Worried About Running Out of Food in the Last Year: Never true    Ran Out of Food in the Last Year: Never true  Transportation Needs: No Transportation Needs (10/25/2017)   PRAPARE - Hydrologist (Medical): No    Lack of Transportation (Non-Medical): No  Physical Activity: Inactive (07/04/2017)   Exercise Vital Sign    Days of Exercise per Week: 0 days    Minutes of Exercise per Session: 0 min  Stress: Stress Concern Present (07/04/2017)   [place]    Feeling of Stress : To some extent  Social Connections: Moderately Isolated (10/25/2017)   Social Connection and Isolation Panel [NHANES]    Frequency of Communication with Friends and Family: Once a week    Frequency of Social Gatherings with Friends and Family: Once a week    Attends Religious Services: Never    Marine scientist or Organizations: No    Attends Music therapist: Never    Marital Status: Married    Allergies: No Known Allergies  Metabolic Disorder Labs: Lab Results  Component Value Date   HGBA1C 5.7 (H) 04/21/2021   No results found for: "PROLACTIN" Lab Results  Component Value Date   CHOL 163 04/21/2021   TRIG 89 04/21/2021   HDL 46 04/21/2021   CHOLHDL 3.5 04/21/2021   [place] 100 (H) 04/21/2021   [place] 86 08/29/2017   Lab Results  Component Value Date   TSH 1.514 07/21/2021   TSH 1.100  11/30/2018    Therapeutic Level Labs: No results found for: "LITHIUM" No results found for: "VALPROATE" No results found for: "CBMZ"  Current Medications: Current Outpatient Medications  Medication Sig Dispense Refill   FLUoxetine (PROZAC) 20 MG capsule Take 1 capsule (20 mg total) by mouth daily with breakfast. 30 capsule 1   hydrOXYzine (ATARAX) 10 MG tablet TAKE 1  TABLET(10 MG) BY MOUTH TWICE DAILY AS NEEDED FOR ANXIETY OR SEVERE ANXIETY 60 tablet 1   levonorgestrel (MIRENA) 20 MCG/24HR IUD 1 each by Intrauterine route once.     propranolol (INDERAL) 10 MG tablet TAKE 1 TABLET BY MOUTH THREE TIMES DAILY AS NEEDED FOR RESTLESNESS AND ANXIETY 270 tablet 0   traZODone (DESYREL) 50 MG tablet Take 1.5-2 tablets (75-100 mg total) by mouth at bedtime as needed for sleep. 60 tablet 1   VRAYLAR 1.5 MG capsule TAKE 1 CAPSULE(1.5 MG) BY MOUTH DAILY 30 capsule 1   lamoTRIgine (LAMICTAL) 25 MG tablet Take 1-2 tablets (25-50 mg total) by mouth as directed. Take 1 tablet daily AM and 2 tablets daily PM 270 tablet 0   No current facility-administered medications for this visit.     Musculoskeletal: Strength & Muscle Tone: within normal limits Gait & Station: normal Patient leans: Right  Psychiatric Specialty Exam: Review of Systems  Psychiatric/Behavioral:  Positive for decreased concentration and dysphoric mood. The patient is nervous/anxious.   All other systems reviewed and are negative.   Blood pressure 134/77, pulse (!) 55, temperature 97.8 F (36.6 C), temperature source Temporal, height _0  (1.6 m), weight 185 lb 6.4 oz (84.1 kg).Body mass index is 32.84 kg/m.  General Appearance: Casual  Eye Contact:  Fair  Speech:  Clear and Coherent  Volume:  Normal  Mood:  Anxious and Depressed  Affect:  Full Range  Thought Process:  Goal Directed and Descriptions of Associations: Intact  Orientation:  Full (Time, Place, and Person)  Thought Content: Logical   Suicidal Thoughts:  No   Homicidal Thoughts:  No  Memory:  Immediate;   Fair Recent;   Fair Remote;   Fair  Judgement:  Fair  Insight:  Fair  Psychomotor Activity:  Normal  Concentration:  Concentration: Fair and Attention Span: Fair  Recall:  [employer] of Knowledge: Fair  Language: Fair  Akathisia:  No  Handed:  Right  AIMS (if indicated): done  Assets:  Communication Skills Desire for Improvement Housing Social Support Transportation  ADL's:  Intact  Cognition: WNL  Sleep:  Fair   Screenings: Administrator, [employer] [facility] Office Visit from 11/27/2021 in [facility] Office Visit from 09/18/2021 in [facility] Visit from 08/07/2021 in [facility] Office Visit from 07/28/2021 in [facility] Total Score 0 0 0 0      [place] Visit from 03/29/2022 in [facility] from 11/27/2021 in [facility] from 08/07/2021 in [facility] Visit from 07/21/2021 in [facility]  Total GAD-7 Score _1 PHQ2-9    [place] Visit from 03/29/2022 in [facility] from 11/27/2021 in [place] from 09/23/2021 in [place] Visit from 09/18/2021 in [place] Visit from 08/07/2021 in [facility]  PHQ-2 Total Score _2 PHQ-9 Total Score _3 [place] Office Visit from 03/29/2022 in [facility] from 11/27/2021 in [facility] from 09/23/2021 in [facility] Error: Q3, 4, or 5  should not be populated when Q2  is No        Assessment and Plan: Suzanne Boyd is a 31 year old Caucasian female who has a history of bipolar disorder, GAD was evaluated in office today.  Patient is currently improving although she continues to struggle with lack of motivation, anxiety, will benefit from the following plan.  Plan Bipolar disorder type I depressed in partial remission Vraylar 1.5 mg p.o. daily-dose reduced due to side effects. Lamotrigine 75 mg p.o. daily in divided dosage Continue trazodone 75-100 mg p.o. nightly as needed  GAD-unstable Start Prozac 20 mg p.o. daily with breakfast Provided medication education including side effects. Continue CBT with Ms. Christina Hussami Hydroxyzine 25 mg p.o. twice daily as needed for severe anxiety Propranolol 10 mg p.o. daily as needed for anxiety attacks  High risk medication use-will order prolactin level-patient to go to [facility].  Discussed lifestyle modification, diet management, joining a gym.  Follow-up in clinic in 6 to 7 weeks or sooner if needed.     Ursula Alert, MD 03/29/2022, 11:53 AM

## 2022-03-29 NOTE — Patient Instructions (Signed)
Fluoxetine Capsules or Tablets (Depression/Mood Disorders) What is this medication? FLUOXETINE (floo OX e teen) treats depression, anxiety, obsessive-compulsive disorder (OCD), and eating disorders. It increases the amount of serotonin in the brain, a hormone that helps regulate mood. It belongs to a group of medications called SSRIs. This medicine may be used for other purposes; ask your health care provider or pharmacist if you have questions. COMMON BRAND NAME(S): Prozac What should I tell my care team before I take this medication? They need to know if you have any of these conditions: Bipolar disorder or a family history of bipolar disorder Bleeding disorders Glaucoma Heart disease Liver disease Low levels of sodium in the blood Seizures Suicidal thoughts, plans, or attempt; a previous suicide attempt by you or a family member Take MAOIs like Carbex, Eldepryl, Marplan, Nardil, and Parnate Take medications that treat or prevent blood clots Thyroid disease An unusual or allergic reaction to fluoxetine, other medications, foods, dyes, or preservatives Pregnant or trying to get pregnant Breast-feeding How should I use this medication? Take this medication by mouth with a glass of water. Follow the directions on the prescription label. You can take this medication with or without food. Take your medication at regular intervals. Do not take it more often than directed. Do not stop taking this medication suddenly except upon the advice of your care team. Stopping this medication too quickly may cause serious side effects or your condition may worsen. A special MedGuide will be given to you by the pharmacist with each prescription and refill. Be sure to read this information carefully each time. Talk to your care team about the use of this medication in children. While it may be prescribed for children as young as 7 years for selected conditions, precautions do apply. Overdosage: If you think  you have taken too much of this medicine contact a poison control center or emergency room at once. NOTE: This medicine is only for you. Do not share this medicine with others. What if I miss a dose? If you miss a dose, skip the missed dose and go back to your regular dosing schedule. Do not take double or extra doses. What may interact with this medication? Do not take this medication with any of the following: Other medications containing fluoxetine, like Sarafem or Symbyax Cisapride Dronedarone Linezolid MAOIs like Carbex, Eldepryl, Marplan, Nardil, and Parnate Methylene blue (injected into a vein) Pimozide Thioridazine This medication may also interact with the following: Alcohol Amphetamines Aspirin and aspirin-like medications Carbamazepine Certain medications for depression, anxiety, or psychotic disturbances Certain medications for migraine headaches like almotriptan, eletriptan, frovatriptan, naratriptan, rizatriptan, sumatriptan, zolmitriptan Digoxin Diuretics Fentanyl Flecainide Furazolidone Isoniazid Lithium Medications for sleep Medications that treat or prevent blood clots like warfarin, enoxaparin, and dalteparin NSAIDs, medications for pain and inflammation, like ibuprofen or naproxen Other medications that prolong the QT interval (an abnormal heart rhythm) Phenytoin Procarbazine Propafenone Rasagiline Ritonavir Supplements like St. John's wort, kava kava, valerian Tramadol Tryptophan Vinblastine This list may not describe all possible interactions. Give your health care provider a list of all the medicines, herbs, non-prescription drugs, or dietary supplements you use. Also tell them if you smoke, drink alcohol, or use illegal drugs. Some items may interact with your medicine. What should I watch for while using this medication? Tell your care team if your symptoms do not get better or if they get worse. Visit your care team for regular checks on your  progress. Because it may take several weeks to see the   full effects of this medication, it is important to continue your treatment as prescribed. Watch for new or worsening thoughts of suicide or depression. This includes sudden changes in mood, behavior, or thoughts. These changes can happen at any time but are more common in the beginning of treatment or after a change in dose. Call your care team right away if you experience these thoughts or worsening depression. Manic episodes may happen in patients with bipolar disorder who take this medication. Watch for changes in feelings or behaviors such as feeling anxious, nervous, agitated, panicky, irritable, hostile, aggressive, impulsive, severely restless, overly excited and hyperactive, or trouble sleeping. These symptoms can happen at anytime but are more common in the beginning of treatment or after a change in dose. Call your care team right away if you notice any of these symptoms. You may get drowsy or dizzy. Do not drive, use machinery, or do anything that needs mental alertness until you know how this medication affects you. Do not stand or sit up quickly, especially if you are an older patient. This reduces the risk of dizzy or fainting spells. Alcohol may interfere with the effect of this medication. Avoid alcoholic drinks. Your mouth may get dry. Chewing sugarless gum or sucking hard candy, and drinking plenty of water may help. Contact your care team if the problem does not go away or is severe. This medication may affect blood sugar levels. If you have diabetes, check with your care team before you make changes to your diet or medications. What side effects may I notice from receiving this medication? Side effects that you should report to your care team as soon as possible: Allergic reactions--skin rash, itching, hives, swelling of the face, lips, tongue, or throat Bleeding--bloody or black, tar-like stools, red or dark brown urine, vomiting  blood or brown material that looks like coffee grounds, small, red or purple spots on skin, unusual bleeding or bruising Heart rhythm changes--fast or irregular heartbeat, dizziness, feeling faint or lightheaded, chest pain, trouble breathing Loss of appetite with weight loss Low sodium level--muscle weakness, fatigue, dizziness, headache, confusion Serotonin syndrome--irritability, confusion, fast or irregular heartbeat, muscle stiffness, twitching muscles, sweating, high fever, seizure, chills, vomiting, diarrhea Sudden eye pain or change in vision such as blurry vision, seeing halos around lights, vision loss Thoughts of suicide or self-harm, worsening mood, feelings of depression Side effects that usually do not require medical attention (report to your care team if they continue or are bothersome): Anxiety, nervousness Change in sex drive or performance Diarrhea Dry mouth Headache Excessive sweating Nausea Tremors or shaking Trouble sleeping Upset stomach This list may not describe all possible side effects. Call your doctor for medical advice about side effects. You may report side effects to FDA at 1-800-FDA-1088. Where should I keep my medication? Keep out of the reach of children and pets. Store at room temperature between 15 and 30 degrees C (59 and 86 degrees F). Get rid of any unused medication after the expiration date. NOTE: This sheet is a summary. It may not cover all possible information. If you have questions about this medicine, talk to your doctor, pharmacist, or health care provider.  2023 Elsevier/Gold Standard (2020-05-05 00:00:00)  

## 2022-03-30 LAB — PROLACTIN: Prolactin: 15.3 ng/mL (ref 4.8–23.3)

## 2022-03-30 NOTE — Plan of Care (Signed)
  Problem: Depression  Goal: Decrease depressive symptoms and improve levels of effective functioning-pt reports a decrease in overall depression symptoms 3 out of 5 sessions documented.  Outcome: Progressing Goal: Develop healthy thinking patterns and beliefs about self, others, and the world that lead to the alleviation and help prevent the relapse of depression 3 out of 5 documented sessions Outcome: Progressing Intervention: REVIEW PLEASE SKILLS (TREAT PHYSICAL ILLNESS, BALANCE EATING, AVOID MOOD-ALTERING SUBSTANCES, BALANCE SLEEP AND GET EXERCISE) WITH Mesha Description: Reviewed  Note: Reviewed  Intervention: Encourage verbalization of feelings/concerns/expectations Description: Allowed pt to explore/express Note: Explored  Intervention: Encourage self-care activities Description: Reviewed  Note: Encouraged    Problem: Anxiety Disorder  Goal:  Reduce overall frequency, intensity, and duration of the anxiety so that daily functioning is not impaired per pt self report 3 out of 5 sessions documented.   Outcome: Progressing Goal: Enhance ability to effectively cope with the full variety of life's worries and anxiety per self report 3 out of 5 sessions documented Outcome: Progressing Intervention: Assist with relaxation techniques, as appropriate (deep breathing exercises, meditation, guided imagery) Description: Reviewed  Note: Reviewed

## 2022-04-22 ENCOUNTER — Encounter: Payer: 59 | Admitting: Family Medicine

## 2022-05-24 ENCOUNTER — Ambulatory Visit (INDEPENDENT_AMBULATORY_CARE_PROVIDER_SITE_OTHER): Payer: 59 | Admitting: Psychiatry

## 2022-05-24 ENCOUNTER — Encounter: Payer: Self-pay | Admitting: Psychiatry

## 2022-05-24 VITALS — BP 110/70 | HR 48 | Temp 98.0°F | Ht 63.0 in | Wt 189.8 lb

## 2022-05-24 DIAGNOSIS — F411 Generalized anxiety disorder: Secondary | ICD-10-CM

## 2022-05-24 DIAGNOSIS — F3175 Bipolar disorder, in partial remission, most recent episode depressed: Secondary | ICD-10-CM | POA: Diagnosis not present

## 2022-05-24 DIAGNOSIS — Z79899 Other long term (current) drug therapy: Secondary | ICD-10-CM | POA: Diagnosis not present

## 2022-05-24 MED ORDER — CARIPRAZINE HCL 1.5 MG PO CAPS: ORAL_CAPSULE | ORAL | 2 refills | Status: DC

## 2022-05-24 MED ORDER — FLUOXETINE HCL 20 MG PO CAPS: 20.0000 mg | ORAL_CAPSULE | Freq: Every day | ORAL | 2 refills | Status: DC

## 2022-05-24 MED ORDER — TRAZODONE HCL 50 MG PO TABS: 75.0000 mg | ORAL_TABLET | Freq: Every evening | ORAL | 2 refills | Status: DC | PRN

## 2022-05-24 MED ORDER — LAMOTRIGINE 100 MG PO TABS: 50.0000 mg | ORAL_TABLET | Freq: Two times a day (BID) | ORAL | 0 refills | Status: DC

## 2022-05-24 NOTE — Progress Notes (Signed)
BH MD OP Progress Note  05/24/2022 11:20 AM Suzanne Boyd  MRN:  [id number]  Chief Complaint:  Chief Complaint  Patient presents with   Follow-up   Depression   Anxiety   Medication Refill   HPI: Suzanne Boyd is a 32 year old Caucasian female, employed, divorced, lives in [place], has a history of bipolar disorder, GAD, insomnia, restless legs syndrome was evaluated in office today.  Patient today reports she had a good holiday break.  She is currently back at work.  Works second shift from 3 PM to 11 PM.  That is working out better for her.  She is off most Saturday and Sundays.  Was able to spend time with her father and her brother during the holiday season.  She enjoyed it.  Patient reports overall mood symptoms as improving.  Continues to have some lack of motivation as well as nervousness, anxiety worrying.  Currently compliant on medications per report.  Denies side effects.  Agreeable to dosage increase of Lamictal further.  Patient reports sleep as better.  Denies any suicidality, homicidality or perceptual disturbances.  Denies any substance abuse problems.  Reports she has been trying to get out more, socialize with friends.  Reviewed and discussed labs-prolactin level, discussed with patient.  Denies any other concerns today.  Visit Diagnosis:    ICD-10-CM   1. Bipolar 1 disorder, depressed, partial remission (HCC)  F31.75 cariprazine (VRAYLAR) 1.5 MG capsule    lamoTRIgine (LAMICTAL) 100 MG tablet    traZODone (DESYREL) 50 MG tablet    2. GAD (generalized anxiety disorder)  F41.1 cariprazine (VRAYLAR) 1.5 MG capsule    traZODone (DESYREL) 50 MG tablet    FLUoxetine (PROZAC) 20 MG capsule    3. High risk medication use  Z79.899       Past Psychiatric History: Reviewed past psychiatric history from progress note on 10/25/2017.  Past trials of Paxil, Lexapro, Abilify, risperidone, Wellbutrin.  Past Medical History:  Past Medical History:  Diagnosis Date    Anxiety    BRCA negative 10/2019   MyRIsk neg   Depression    Family history of breast cancer    Increased risk of breast cancer 10/2019   IBIS=19.4%/riskscore=22.2%   LGSIL on Pap smear of cervix     Past Surgical History:  Procedure Laterality Date   INTRAUTERINE DEVICE (IUD) INSERTION      Family Psychiatric History: Reviewed family psychiatric history from progress note on 10/25/2017.  Family History:  Family History  Problem Relation Age of Onset   Hyperlipidemia Mother    Hypertension Mother    Varicose Veins Mother    Hypertension Father    Thyroid disease Father    Healthy Brother    Bipolar disorder Maternal Aunt    Cancer - Colon Maternal Uncle    Thyroid disease Maternal Grandfather    Cancer Maternal Grandmother 71       Breast   Depression Maternal Grandmother    Brain cancer Maternal Grandmother    Cancer Other 90       breast   Anxiety disorder Other    Cancer Other 55       breast   Cancer Other 55       breast   Cancer Other 55       breast   Cancer Other 60       breast   Cancer Other 60       breast   Cancer Other 60  breast   Ovarian cancer Neg Hx     Social History: Reviewed social history from progress note on 10/25/2017. Social History   Socioeconomic History   Marital status: Divorced    Spouse name: Not on file   Number of children: 0   Years of education: 12   Highest education level: High school graduate  Occupational History   Occupation: Air cabin crew    Comment: full time  Tobacco Use   Smoking status: Never   Smokeless tobacco: Never  Vaping Use   Vaping Use: Never used  Substance and Sexual Activity   Alcohol use: Not Currently    Comment: occasional- 1 drink per month   Drug use: Yes    Types: Marijuana    Comment: last used 3 weeks ago   Sexual activity: Yes    Partners: Male    Birth control/protection: I.U.D.    Comment: Mirena  Other Topics Concern   Not on file  Social History Narrative   Not on  file   Social Determinants of Health   Financial Resource Strain: Low Risk  (10/25/2017)   Overall Financial Resource Strain (CARDIA)    Difficulty of Paying Living Expenses: Not hard at all  Food Insecurity: No Food Insecurity (10/25/2017)   Hunger Vital Sign    Worried About Running Out of Food in the Last Year: Never true    Ran Out of Food in the Last Year: Never true  Transportation Needs: No Transportation Needs (10/25/2017)   PRAPARE - Administrator, Civil Service (Medical): No    Lack of Transportation (Non-Medical): No  Physical Activity: Inactive (07/04/2017)   Exercise Vital Sign    Days of Exercise per Week: 0 days    Minutes of Exercise per Session: 0 min  Stress: Stress Concern Present (07/04/2017)   [employer] of Occupational Health - Occupational Stress Questionnaire    Feeling of Stress : To some extent  Social Connections: Moderately Isolated (10/25/2017)   Social Connection and Isolation Panel [NHANES]    Frequency of Communication with Friends and Family: Once a week    Frequency of Social Gatherings with Friends and Family: Once a week    Attends Religious Services: Never    Database administrator or Organizations: No    Attends Engineer, structural: Never    Marital Status: Married    Allergies: No Known Allergies  Metabolic Disorder Labs: Lab Results  Component Value Date   HGBA1C 5.7 (H) 04/21/2021   Lab Results  Component Value Date   PROLACTIN 15.3 03/29/2022   Lab Results  Component Value Date   CHOL 163 04/21/2021   TRIG 89 04/21/2021   HDL 46 04/21/2021   CHOLHDL 3.5 04/21/2021   LDLCALC 100 (H) 04/21/2021   LDLCALC 86 08/29/2017   Lab Results  Component Value Date   TSH 1.514 07/21/2021   TSH 1.100 11/30/2018    Therapeutic Level Labs: No results found for: "LITHIUM" No results found for: "VALPROATE" No results found for: "CBMZ"  Current Medications: Current Outpatient Medications  Medication Sig  Dispense Refill   hydrOXYzine (ATARAX) 10 MG tablet TAKE 1 TABLET(10 MG) BY MOUTH TWICE DAILY AS NEEDED FOR ANXIETY OR SEVERE ANXIETY 60 tablet 1   lamoTRIgine (LAMICTAL) 100 MG tablet Take 0.5 tablets (50 mg total) by mouth 2 (two) times daily. 90 tablet 0   levonorgestrel (MIRENA) 20 MCG/24HR IUD 1 each by Intrauterine route once.     propranolol (INDERAL) 10  MG tablet TAKE 1 TABLET BY MOUTH THREE TIMES DAILY AS NEEDED FOR RESTLESNESS AND ANXIETY 270 tablet 0   cariprazine (VRAYLAR) 1.5 MG capsule TAKE 1 CAPSULE(1.5 MG) BY MOUTH DAILY 30 capsule 2   FLUoxetine (PROZAC) 20 MG capsule Take 1 capsule (20 mg total) by mouth daily with breakfast. 30 capsule 2   traZODone (DESYREL) 50 MG tablet Take 1.5-2 tablets (75-100 mg total) by mouth at bedtime as needed for sleep. 60 tablet 2   No current facility-administered medications for this visit.     Musculoskeletal: Strength & Muscle Tone: within normal limits Gait & Station: normal Patient leans: N/A  Psychiatric Specialty Exam: Review of Systems  Psychiatric/Behavioral:  The patient is nervous/anxious.        Lack of interest although improving  All other systems reviewed and are negative.   Blood pressure 110/70, pulse (!) 48, temperature 98 F (36.7 C), temperature source Oral, height 5\' 3"  (1.6 m), weight 189 lb 12.8 oz (86.1 kg), last menstrual period 05/24/2022, SpO2 99 %.Body mass index is 33.62 kg/m.  General Appearance: Casual  Eye Contact:  Fair  Speech:  Normal Rate  Volume:  Normal  Mood:  Anxious  Affect:  Congruent  Thought Process:  Goal Directed and Descriptions of Associations: Intact  Orientation:  Full (Time, Place, and Person)  Thought Content: Logical   Suicidal Thoughts:  No  Homicidal Thoughts:  No  Memory:  Immediate;   Fair Recent;   Fair Remote;   Fair  Judgement:  Fair  Insight:  Fair  Psychomotor Activity:  Normal  Concentration:  Concentration: Fair and Attention Span: Fair  Recall:  07/23/2022  of Knowledge: Fair  Language: Fair  Akathisia:  No  Handed:  Right  AIMS (if indicated): done  Assets:  Communication Skills Desire for Improvement Housing Social Support  ADL's:  Intact  Cognition: WNL  Sleep:  Fair   Screenings: AIMS    Flowsheet Row Office Visit from 05/24/2022 in [facility] Office Visit from 03/29/2022 in [facility] Office Visit from 11/27/2021 in [facility] Office Visit from 09/18/2021 in [facility] Office Visit from 08/07/2021 in [facility]  AIMS Total Score 0 0 0 0 0      GAD-7    Flowsheet Row Office Visit from 05/24/2022 in [facility] Office Visit from 03/29/2022 in [facility] Office Visit from 11/27/2021 in [facility] Office Visit from 08/07/2021 in [facility] Office Visit from 07/21/2021 in [facility]  Total GAD-7 Score 4 10 16 11 21       PHQ2-9    Flowsheet Row Office Visit from 05/24/2022 in [facility] Office Visit from 03/29/2022 in [facility] Office Visit from 11/27/2021 in [facility] Counselor from 09/23/2021 in [facility] Office Visit from 09/18/2021 in [facility]  PHQ-2 Total Score 1 2 3 6 6   PHQ-9 Total Score 4 8 12 23 24       Flowsheet Row Office Visit from 05/24/2022 in [facility] Most recent reading at 05/24/2022 10:54 AM Counselor from 03/29/2022 in [facility] OUTPAT[facility]  Most recent reading at 03/29/2022  1:47 PM Office Visit from 03/29/2022 in [facility] Most recent reading at 03/29/2022 11:44 AM  C-SSRS RISK CATEGORY No Risk Low  Risk Low Risk  Assessment and Plan: Suzanne Boyd is a 32 year old Caucasian female who has a history of bipolar disorder, GAD was evaluated in office today.  Patient is currently improving, will continue to benefit from dosage increase of Lamictal, plan as noted below.  Plan Bipolar disorder type I depressed in partial remission Vraylar 1.5 mg p.o. daily.  Will consider tapering this medication off once patient is stable for 3 to 6 months. Increase lamotrigine to 100 mg p.o. daily in divided dosage Trazodone 75-100 mg p.o. nightly as needed  GAD-proving Prozac 20 mg p.o. daily Continue CBT with Ms. Christina Hussami Hydroxyzine 25 mg p.o. twice daily as needed for severe anxiety Propranolol 10 mg p.o. daily as needed for severe anxiety attacks-patient has not been using this medication.  Patient with heart rate of 48 today, however she has always had low heart rate, currently asymptomatic.  Patient to reach out to primary care provider if she has any symptoms of chest pain or other.  High risk medication use-reviewed and discussed prolactin level-03/29/2022-15.3.  Follow-up in clinic in 3 months or sooner if needed.   This note was generated in part or whole with voice recognition software. Voice recognition is usually quite accurate but there are transcription errors that can and very often do occur. I apologize for any typographical errors that were not detected and corrected.     Ursula Alert, MD 05/24/2022, 11:20 AM

## 2022-07-04 ENCOUNTER — Other Ambulatory Visit: Payer: Self-pay | Admitting: Psychiatry

## 2022-07-04 DIAGNOSIS — F411 Generalized anxiety disorder: Secondary | ICD-10-CM

## 2022-07-04 DIAGNOSIS — F3175 Bipolar disorder, in partial remission, most recent episode depressed: Secondary | ICD-10-CM

## 2022-07-05 ENCOUNTER — Telehealth: Payer: Self-pay | Admitting: Psychiatry

## 2022-07-05 DIAGNOSIS — F411 Generalized anxiety disorder: Secondary | ICD-10-CM

## 2022-07-05 MED ORDER — FLUOXETINE HCL 20 MG PO CAPS: 20.0000 mg | ORAL_CAPSULE | Freq: Every day | ORAL | 0 refills | Status: DC

## 2022-07-05 NOTE — Telephone Encounter (Signed)
I have sent a 90-day supply of medication to pharmacy.

## 2022-07-05 NOTE — Telephone Encounter (Signed)
tried to contact patient but no answer

## 2022-07-05 NOTE — Telephone Encounter (Signed)
received fax requesting a 90 day supply of the fluoxetine 16m per insurance

## 2022-08-16 NOTE — Progress Notes (Signed)
   Chief Complaint  Patient presents with   Contraception    Kyleena replacement     History of Present Illness:  Suzanne Boyd is a 32 y.o. that had a  [place]  IUD placed approximately 5 years ago. Does get random periods. Would like another one.  Annual due 5/24.    BP 100/70   Ht 5\' 3"  (1.6 m)   Wt 186 lb (84.4 kg)   LMP 07/19/2022 (Approximate)   BMI 32.95 kg/m   Pelvic exam:  Two IUD strings present seen coming from the cervical os. EGBUS, vaginal vault and cervix: within normal limits  IUD Removal Strings of IUD identified and grasped.  IUD removed without problem with ring forceps.  Pt tolerated this well.  IUD noted to be intact.  IUD Insertion Procedure Note Patient identified, informed consent performed, consent signed.   Discussed risks of irregular bleeding, cramping, infection, malpositioning or misplacement of the IUD outside the uterus which may require further procedure such as laparoscopy, risk of failure <1%. Time out was performed.  Urine pregnancy test negative.  Speculum placed in the vagina.  Cervix visualized.  Cleaned with Betadine x 2.  Grasped anteriorly with a single tooth tenaculum.  Uterus sounded to 7.0 cm.   IUD placed per manufacturer's recommendations.  Strings trimmed to 3 cm. Tenaculum was removed, good hemostasis noted.  Patient tolerated procedure well.   Results for orders placed or performed in visit on 08/17/22 (from the past 24 hour(s))  POCT urine pregnancy     Status: Normal   Collection Time: 08/17/22 10:47 AM  Result Value Ref Range   Preg Test, Ur Negative Negative     ASSESSMENT:  Encounter for removal and reinsertion of intrauterine contraceptive device (IUD) - Plan: POCT urine pregnancy, levonorgestrel (KYLEENA) 19.5 MG IUD   Meds ordered this encounter  Medications   levonorgestrel (KYLEENA) 19.5 MG IUD     Plan:  Patient was given post-procedure instructions.  She was advised to have backup contraception for one  week.   Call if you are having increasing pain, cramps or bleeding or if you have a fever greater than 100.4 degrees F., shaking chills, nausea or vomiting. Patient was also asked to check IUD strings periodically and follow up in 4 weeks for IUD check.  Return in about 5 weeks (around 09/21/2022) for annual, IUD f/u.  Suzanne Mogel B. Abbigayle Toole, PA-C 08/17/2022 11:17 AM

## 2022-08-17 ENCOUNTER — Ambulatory Visit (INDEPENDENT_AMBULATORY_CARE_PROVIDER_SITE_OTHER): Payer: 59 | Admitting: Obstetrics and Gynecology

## 2022-08-17 ENCOUNTER — Encounter: Payer: Self-pay | Admitting: Obstetrics and Gynecology

## 2022-08-17 VITALS — BP 100/70 | Ht 63.0 in | Wt 186.0 lb

## 2022-08-17 DIAGNOSIS — Z3202 Encounter for pregnancy test, result negative: Secondary | ICD-10-CM

## 2022-08-17 DIAGNOSIS — Z30433 Encounter for removal and reinsertion of intrauterine contraceptive device: Secondary | ICD-10-CM

## 2022-08-17 LAB — POCT URINE PREGNANCY: Preg Test, Ur: NEGATIVE

## 2022-08-17 MED ORDER — LEVONORGESTREL 19.5 MG IU IUD: INTRAUTERINE_SYSTEM | Freq: Once | INTRAUTERINE | Status: AC

## 2022-08-17 NOTE — Patient Instructions (Signed)
I value your feedback and you entrusting us with your care. If you get a [facility] patient survey, I would appreciate you taking the time to let us know about your experience today. Thank you!  Instructions after IUD insertion  Most women experience no significant problems after insertion of an IUD, however minor cramping and spotting for a few days is common. Cramps may be treated with ibuprofen 800mg every 8 hours or Tylenol 650 mg every 4 hours. Contact [facility] immediately if you experience any of the following symptoms during the next week: temperature >99.6 degrees, worsening pelvic pain, abdominal pain, fainting, unusually heavy vaginal bleeding, foul vaginal discharge, or if you think you have expelled the IUD. Nothing inserted in the vagina for 48 hours. You will be scheduled for a follow up visit in approximately four weeks.    

## 2022-08-18 ENCOUNTER — Encounter: Payer: Self-pay | Admitting: Obstetrics and Gynecology

## 2022-08-23 ENCOUNTER — Encounter: Payer: Self-pay | Admitting: Psychiatry

## 2022-08-23 ENCOUNTER — Ambulatory Visit (INDEPENDENT_AMBULATORY_CARE_PROVIDER_SITE_OTHER): Payer: 59 | Admitting: Psychiatry

## 2022-08-23 VITALS — BP 107/72 | HR 58 | Temp 97.7°F | Ht 63.0 in | Wt 182.4 lb

## 2022-08-23 DIAGNOSIS — F3176 Bipolar disorder, in full remission, most recent episode depressed: Secondary | ICD-10-CM | POA: Diagnosis not present

## 2022-08-23 DIAGNOSIS — F411 Generalized anxiety disorder: Secondary | ICD-10-CM

## 2022-08-23 MED ORDER — CARIPRAZINE HCL 1.5 MG PO CAPS: ORAL_CAPSULE | ORAL | 2 refills | Status: DC

## 2022-08-23 MED ORDER — HYDROXYZINE HCL 10 MG PO TABS: ORAL_TABLET | ORAL | 1 refills | Status: AC

## 2022-08-23 MED ORDER — LAMOTRIGINE 100 MG PO TABS: 50.0000 mg | ORAL_TABLET | Freq: Two times a day (BID) | ORAL | 0 refills | Status: DC

## 2022-08-23 NOTE — Progress Notes (Unsigned)
BH MD OP Progress Note  08/23/2022 12:07 PM Suzanne Boyd  MRN:  [id number]  Chief Complaint:  Chief Complaint  Patient presents with   Follow-up   Anxiety   Medication Refill   HPI: Suzanne Boyd is a 32 year old Caucasian female, employed, divorced, lives in [place], has a history of bipolar disorder, GAD, insomnia, restless leg syndrome was evaluated in office today.  Patient today reports she is currently doing fairly well with regards to her mood.  Tolerating the Vraylar as well as the Lamictal well.  Denies side effects.  Reports mood symptoms are stable.  Denies any significant depression, anxiety or mania.  Patient reports sleep is overall good.  She works third shifts from 3 PM to 11 PM.  She reports she is able to wind down and fall asleep by around 1 or 1:30 AM.  She does not have a set wake up time however gets around 7 to 8 hours of sleep.  She takes the trazodone as needed only and it helps when she takes it.  Patient reports work is going well.  Denies any suicidality, homicidality or perceptual disturbances.  Patient denies any other concerns today.  Visit Diagnosis:    ICD-10-CM   1. Bipolar disorder, in full remission, most recent episode depressed  F31.76 lamoTRIgine (LAMICTAL) 100 MG tablet    cariprazine (VRAYLAR) 1.5 MG capsule   Type 1    2. GAD (generalized anxiety disorder)  F41.1 cariprazine (VRAYLAR) 1.5 MG capsule    hydrOXYzine (ATARAX) 10 MG tablet      Past Psychiatric History: I have reviewed past psychiatric history from progress note on 10/25/2017.  Past trials of Paxil, Lexapro, Abilify, risperidone, Wellbutrin.  Past Medical History:  Past Medical History:  Diagnosis Date   Anxiety    BRCA negative 10/2019   MyRIsk neg   Depression    Family history of breast cancer    Increased risk of breast cancer 10/2019   IBIS=19.4%/riskscore=22.2%   LGSIL on Pap smear of cervix     Past Surgical History:  Procedure Laterality Date    INTRAUTERINE DEVICE (IUD) INSERTION      Family Psychiatric History: Reviewed family psychiatric history from progress note on 10/25/2017.  Family History:  Family History  Problem Relation Age of Onset   Hyperlipidemia Mother    Hypertension Mother    Varicose Veins Mother    Hypertension Father    Thyroid disease Father    Healthy Brother    Bipolar disorder Maternal Aunt    Cancer - Colon Maternal Uncle    Thyroid disease Maternal Grandfather    Cancer Maternal Grandmother 50       Breast   Depression Maternal Grandmother    Brain cancer Maternal Grandmother    Cancer Other 3       breast   Anxiety disorder Other    Cancer Other 55       breast   Cancer Other 55       breast   Cancer Other 55       breast   Cancer Other 60       breast   Cancer Other 60       breast   Cancer Other 60       breast   Ovarian cancer Neg Hx     Social History: Reviewed social history from progress note on 10/25/2017. Social History   Socioeconomic History   Marital status: Divorced    Spouse name: Not on  file   Number of children: 0   Years of education: 12   Highest education level: High school graduate  Occupational History   Occupation: Air cabin crew    Comment: full time  Tobacco Use   Smoking status: Never   Smokeless tobacco: Never  Vaping Use   Vaping Use: Never used  Substance and Sexual Activity   Alcohol use: Not Currently    Comment: occasional- 1 drink per month   Drug use: Yes    Types: Marijuana    Comment: last used 3 weeks ago   Sexual activity: Yes    Partners: Male    Birth control/protection: I.U.D.    Comment: Kyleena  Other Topics Concern   Not on file  Social History Narrative   Not on file   Social Determinants of Health   Financial Resource Strain: Low Risk  (10/25/2017)   Overall Financial Resource Strain (CARDIA)    Difficulty of Paying Living Expenses: Not hard at all  Food Insecurity: No Food Insecurity (10/25/2017)   Hunger Vital  Sign    Worried About Running Out of Food in the Last Year: Never true    Ran Out of Food in the Last Year: Never true  Transportation Needs: No Transportation Needs (10/25/2017)   PRAPARE - Administrator, Civil Service (Medical): No    Lack of Transportation (Non-Medical): No  Physical Activity: Inactive (07/04/2017)   Exercise Vital Sign    Days of Exercise per Week: 0 days    Minutes of Exercise per Session: 0 min  Stress: Stress Concern Present (07/04/2017)   [employer] of Occupational Health - Occupational Stress Questionnaire    Feeling of Stress : To some extent  Social Connections: Moderately Isolated (10/25/2017)   Social Connection and Isolation Panel [NHANES]    Frequency of Communication with Friends and Family: Once a week    Frequency of Social Gatherings with Friends and Family: Once a week    Attends Religious Services: Never    Database administrator or Organizations: No    Attends Engineer, structural: Never    Marital Status: Married    Allergies: No Known Allergies  Metabolic Disorder Labs: Lab Results  Component Value Date   HGBA1C 5.7 (H) 04/21/2021   Lab Results  Component Value Date   PROLACTIN 15.3 03/29/2022   Lab Results  Component Value Date   CHOL 163 04/21/2021   TRIG 89 04/21/2021   HDL 46 04/21/2021   CHOLHDL 3.5 04/21/2021   LDLCALC 100 (H) 04/21/2021   LDLCALC 86 08/29/2017   Lab Results  Component Value Date   TSH 1.514 07/21/2021   TSH 1.100 11/30/2018    Therapeutic Level Labs: No results found for: "LITHIUM" No results found for: "VALPROATE" No results found for: "CBMZ"  Current Medications: Current Outpatient Medications  Medication Sig Dispense Refill   FLUoxetine (PROZAC) 20 MG capsule Take 1 capsule (20 mg total) by mouth daily with breakfast. 90 capsule 0   levonorgestrel (KYLEENA) 19.5 MG IUD 1 each by Intrauterine route once.     propranolol (INDERAL) 10 MG tablet TAKE 1 TABLET BY MOUTH  THREE TIMES DAILY AS NEEDED FOR RESTLESNESS AND ANXIETY 270 tablet 0   traZODone (DESYREL) 50 MG tablet Take 1.5-2 tablets (75-100 mg total) by mouth at bedtime as needed for sleep. 60 tablet 2   cariprazine (VRAYLAR) 1.5 MG capsule TAKE 1 CAPSULE(1.5 MG) BY MOUTH DAILY 30 capsule 2   hydrOXYzine (ATARAX) 10  MG tablet TAKE 1 TABLET(10 MG) BY MOUTH TWICE DAILY AS NEEDED FOR ANXIETY OR SEVERE ANXIETY 60 tablet 1   lamoTRIgine (LAMICTAL) 100 MG tablet Take 0.5 tablets (50 mg total) by mouth 2 (two) times daily. 90 tablet 0   No current facility-administered medications for this visit.     Musculoskeletal: Strength & Muscle Tone: within normal limits Gait & Station: normal Patient leans: N/A  Psychiatric Specialty Exam: Review of Systems  Psychiatric/Behavioral: Negative.    All other systems reviewed and are negative.   Blood pressure 107/72, pulse (!) 58, temperature 97.7 F (36.5 C), temperature source Skin, height 5\' 3"  (1.6 m), weight 182 lb 6.4 oz (82.7 kg), last menstrual period 07/19/2022.Body mass index is 32.31 kg/m.  General Appearance: Casual  Eye Contact:  Fair  Speech:  Clear and Coherent  Volume:  Normal  Mood:  Euthymic  Affect:  Congruent  Thought Process:  Goal Directed and Descriptions of Associations: Intact  Orientation:  Full (Time, Place, and Person)  Thought Content: Logical   Suicidal Thoughts:  No  Homicidal Thoughts:  No  Memory:  Immediate;   Fair Recent;   Fair Remote;   Fair  Judgement:  Fair  Insight:  Fair  Psychomotor Activity:  Normal  Concentration:  Concentration: Fair and Attention Span: Fair  Recall:  FiservFair  Fund of Knowledge: Fair  Language: Fair  Akathisia:  No  Handed:  Right  AIMS (if indicated): done  Assets:  Communication Skills Desire for Improvement Social Support Transportation  ADL's:  Intact  Cognition: WNL  Sleep:  Fair   Screenings: MidwifeAIMS    Flowsheet Row [facility]  AIMS Total Score 0 0 0 0 0      GAD-7    Flowsheet Row [facility]  Total GAD-7 Score 4 4 10 16 11       PHQ2-9    Flowsheet Row [facility] Counselor from [facility]  PHQ-2 Total Score 2 1 2 3 6   PHQ-9 Total Score 7 4 8 12 23       Flowsheet Row [facility] Office Visit [facility]  C-SSRS RISK CATEGORY No Risk No Risk Low Risk        Assessment and Plan: Suzanne Boyd is a 32 year old Caucasian female who has a history of bipolar disorder, GAD was evaluated in office today.  Patient is currently stable.  Plan as noted below.  Plan Bipolar disorder type I  depressed in full remission Vraylar 1.5 mg p.o. daily Will consider tapering this medication off in the future. Trazodone 75-100 mg p.o. nightly as needed.  GAD- stable Prozac 20 mg po daily Hydroxyzine 25 mg p.o twice daily as needed for severe anxiety Propranolol 10 mg p.o daily as needed for severe anxiety attacks.      Consent: Patient/Guardian gives verbal consent for treatment and assignment of benefits for services provided during this visit. Patient/Guardian expressed understanding and agreed to proceed.   This note was generated in part or whole with voice recognition software. Voice recognition is usually quite accurate but there are transcription errors that can and very often do occur. I apologize for any typographical errors that were not detected and corrected.  This note was generated in part or whole with voice recognition software. Voice recognition is usually quite accurate but there are transcription errors that can and very often do occur. I apologize for any typographical errors that were not detected and corrected.     Jomarie Longs, MD 08/24/2022, 8:42 AM

## 2022-09-02 ENCOUNTER — Other Ambulatory Visit: Payer: Self-pay | Admitting: Psychiatry

## 2022-09-02 DIAGNOSIS — F411 Generalized anxiety disorder: Secondary | ICD-10-CM

## 2022-09-02 DIAGNOSIS — F3175 Bipolar disorder, in partial remission, most recent episode depressed: Secondary | ICD-10-CM

## 2022-09-24 NOTE — Progress Notes (Unsigned)
PCP:  Erasmo Downer, MD   No chief complaint on file.    HPI:      Ms. Suzanne Boyd is a 32 y.o. G0P0000 whose LMP was No LMP recorded. (Menstrual status: IUD)., presents today for her annual examination.  Her menses are random with IUD, lasting 2-3 days, light flow, rare BTB, mild dysmen, improved with NSAIDs.   Sex activity: single partner, contraception - IUD. Kyleena REplaced 08/17/22. Has some vaginal dryness and pain, lubricants with some improvement.  Last Pap: 10/30/19 Results were: no abnormalities /neg HPV DNA   Last mammogram: none There is a strong FH of breast cancer on her mat side in MGM and mat grt aunts. There is no FH of ovarian cancer. The patient does do self-breast exams. Pt is MyRisk neg 6/21; IBIS=19.4%/riskscore=22.2%. Has not had mammo or screen breast MRI.   Tobacco use: The patient denies current or previous tobacco use. Alcohol use: none No drug use.  Exercise: moderately active  She does get adequate calcium but not Vitamin D in her diet.  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   Passive suicidal ideations 04/21/2021   Bipolar 2 disorder, major depressive episode (HCC) 04/17/2020   GAD (generalized anxiety disorder) 04/17/2020   Insomnia 04/17/2020   Family history of breast cancer 10/30/2019   Family history of diabetes mellitus 08/29/2017   Overweight 07/04/2017    Past Surgical History:  Procedure Laterality Date   INTRAUTERINE DEVICE (IUD) INSERTION      Family History  Problem Relation Age of Onset    Hyperlipidemia Mother    Hypertension Mother    Varicose Veins Mother    Hypertension Father    Thyroid disease Father    Healthy Brother    Bipolar disorder Maternal Aunt    Cancer - Colon Maternal Uncle    Thyroid disease Maternal Grandfather    Cancer Maternal Grandmother 54       Breast   Depression Maternal Grandmother    Brain cancer Maternal Grandmother    Cancer Other 55       breast   Anxiety disorder Other    Cancer Other 55       breast   Cancer Other 55       breast   Cancer Other 55       breast   Cancer Other 60       breast   Cancer Other 60       breast   Cancer Other 60       breast   Ovarian cancer Neg Hx     Social History   Socioeconomic History   Marital status: Divorced    Spouse name: Not on file   Number of children: 0   Years of education: 12   Highest education level: High school graduate  Occupational History   Occupation: Air cabin crew    Comment: full time  Tobacco Use   Smoking status: Never   Smokeless tobacco: Never  Vaping Use   Vaping Use: Never used  Substance and  Sexual Activity   Alcohol use: Not Currently    Comment: occasional- 1 drink per month   Drug use: Yes    Types: Marijuana    Comment: last used 3 weeks ago   Sexual activity: Yes    Partners: Male    Birth control/protection: I.U.D.    Comment: Kyleena  Other Topics Concern   Not on file  Social History Narrative   Not on file   Social Determinants of Health   Financial Resource Strain: Low Risk  (10/25/2017)   Overall Financial Resource Strain (CARDIA)    Difficulty of Paying Living Expenses: Not hard at all  Food Insecurity: No Food Insecurity (10/25/2017)   Hunger Vital Sign    Worried About Running Out of Food in the Last Year: Never true    Ran Out of Food in the Last Year: Never true  Transportation Needs: No Transportation Needs (10/25/2017)   PRAPARE - Administrator, Civil Service (Medical): No    Lack of Transportation  (Non-Medical): No  Physical Activity: Inactive (07/04/2017)   Exercise Vital Sign    Days of Exercise per Week: 0 days    Minutes of Exercise per Session: 0 min  Stress: Stress Concern Present (07/04/2017)   [employer] of Occupational Health - Occupational Stress Questionnaire    Feeling of Stress : To some extent  Social Connections: Moderately Isolated (10/25/2017)   Social Connection and Isolation Panel [NHANES]    Frequency of Communication with Friends and Family: Once a week    Frequency of Social Gatherings with Friends and Family: Once a week    Attends Religious Services: Never    Database administrator or Organizations: No    Attends Banker Meetings: Never    Marital Status: Married  Catering manager Violence: Not At Risk (10/25/2017)   Humiliation, Afraid, Rape, and Kick questionnaire    Fear of Current or Ex-Partner: No    Emotionally Abused: No    Physically Abused: No    Sexually Abused: No     Current Outpatient Medications:    cariprazine (VRAYLAR) 1.5 MG capsule, TAKE 1 CAPSULE(1.5 MG) BY MOUTH DAILY, Disp: 30 capsule, Rfl: 2   FLUoxetine (PROZAC) 20 MG capsule, Take 1 capsule (20 mg total) by mouth daily with breakfast., Disp: 90 capsule, Rfl: 0   hydrOXYzine (ATARAX) 10 MG tablet, TAKE 1 TABLET(10 MG) BY MOUTH TWICE DAILY AS NEEDED FOR ANXIETY OR SEVERE ANXIETY, Disp: 60 tablet, Rfl: 1   lamoTRIgine (LAMICTAL) 100 MG tablet, Take 0.5 tablets (50 mg total) by mouth 2 (two) times daily., Disp: 90 tablet, Rfl: 0   levonorgestrel (KYLEENA) 19.5 MG IUD, 1 each by Intrauterine route once., Disp: , Rfl:    propranolol (INDERAL) 10 MG tablet, TAKE 1 TABLET BY MOUTH THREE TIMES DAILY AS NEEDED FOR RESTLESNESS AND ANXIETY, Disp: 270 tablet, Rfl: 0   traZODone (DESYREL) 50 MG tablet, TAKE 1 AND 1/2 TO 2 TABLETS(75 TO 100 MG) BY MOUTH AT BEDTIME AS NEEDED FOR SLEEP, Disp: 60 tablet, Rfl: 2     ROS:  Review of Systems  Constitutional:  Positive for  fatigue. Negative for fever and unexpected weight change.  Respiratory:  Negative for cough, shortness of breath and wheezing.   Cardiovascular:  Negative for chest pain, palpitations and leg swelling.  Gastrointestinal:  Negative for blood in stool, constipation, diarrhea, nausea and vomiting.  Endocrine: Negative for cold intolerance, heat intolerance and polyuria.  Genitourinary:  Positive for dyspareunia.  Negative for dysuria, flank pain, frequency, genital sores, hematuria, menstrual problem, pelvic pain, urgency, vaginal bleeding, vaginal discharge and vaginal pain.  Musculoskeletal:  Positive for arthralgias. Negative for back pain, joint swelling and myalgias.  Skin:  Negative for rash.  Neurological:  Negative for dizziness, syncope, light-headedness, numbness and headaches.  Hematological:  Negative for adenopathy.  Psychiatric/Behavioral:  Positive for agitation and dysphoric mood. Negative for confusion, sleep disturbance and suicidal ideas. The patient is not nervous/anxious.    BREAST: No symptoms   Objective: There were no vitals taken for this visit.   Physical Exam Constitutional:      Appearance: She is well-developed.  Genitourinary:     Vulva normal.     Right Labia: No rash, tenderness or lesions.    Left Labia: No tenderness, lesions or rash.    No vaginal discharge, erythema or tenderness.      Right Adnexa: not tender and no mass present.    Left Adnexa: not tender and no mass present.    No cervical friability or polyp.     IUD strings visualized.     Uterus is not enlarged or tender.  Breasts:    Right: No mass, nipple discharge, skin change or tenderness.     Left: No mass, nipple discharge, skin change or tenderness.  Neck:     Thyroid: No thyromegaly.  Cardiovascular:     Rate and Rhythm: Normal rate and regular rhythm.     Heart sounds: Normal heart sounds. No murmur heard. Pulmonary:     Effort: Pulmonary effort is normal.     Breath sounds:  Normal breath sounds.  Abdominal:     Palpations: Abdomen is soft.     Tenderness: There is no abdominal tenderness. There is no guarding or rebound.  Musculoskeletal:        General: Normal range of motion.     Cervical back: Normal range of motion.  Lymphadenopathy:     Cervical: No cervical adenopathy.  Neurological:     General: No focal deficit present.     Mental Status: She is alert and oriented to person, place, and time.     Cranial Nerves: No cranial nerve deficit.  Skin:    General: Skin is warm and dry.  Psychiatric:        Mood and Affect: Mood normal.        Behavior: Behavior normal.        Thought Content: Thought content normal.        Judgment: Judgment normal.  Vitals reviewed.     Assessment/Plan: Encounter for annual routine gynecological examination  Encounter for routine checking of intrauterine contraceptive device (IUD)--good till 3/24. Pt to f/u for removal and either replacement or different BC option.   Encounter for screening mammogram for malignant neoplasm of breast - Plan: MM 3D SCREEN BREAST BILATERAL; pt to schedule mammo  Family history of breast cancer - Plan: MM 3D SCREEN BREAST BILATERAL; pt is MyRisk neg  Increased risk of breast cancer - Plan: MM 3D SCREEN BREAST BILATERAL; pt aware of recommendations of monthly SBE, yearly CBE and mammos and scr breast MRI. Add Vit D 3. Do mammo now, can do MRI prn if interested            GYN counsel breast self exam, mammography screening, adequate intake of calcium and vitamin D, diet and exercise     F/U  No follow-ups on file.  Zacharius Funari B. March Steyer, PA-C 09/24/2022 1:35 PM

## 2022-09-27 ENCOUNTER — Ambulatory Visit (INDEPENDENT_AMBULATORY_CARE_PROVIDER_SITE_OTHER): Payer: 59 | Admitting: Obstetrics and Gynecology

## 2022-09-27 ENCOUNTER — Encounter: Payer: Self-pay | Admitting: Obstetrics and Gynecology

## 2022-09-27 VITALS — BP 100/60 | Ht 63.0 in | Wt 184.0 lb

## 2022-09-27 DIAGNOSIS — Z30431 Encounter for routine checking of intrauterine contraceptive device: Secondary | ICD-10-CM

## 2022-09-27 DIAGNOSIS — Z9189 Other specified personal risk factors, not elsewhere classified: Secondary | ICD-10-CM

## 2022-09-27 DIAGNOSIS — Z01419 Encounter for gynecological examination (general) (routine) without abnormal findings: Secondary | ICD-10-CM

## 2022-09-27 DIAGNOSIS — Z803 Family history of malignant neoplasm of breast: Secondary | ICD-10-CM

## 2022-09-27 DIAGNOSIS — Z1231 Encounter for screening mammogram for malignant neoplasm of breast: Secondary | ICD-10-CM

## 2022-09-27 NOTE — Patient Instructions (Addendum)
I value your feedback and you entrusting us with your care. If you get a [facility] patient survey, I would appreciate you taking the time to let us know about your experience today. Thank you!  [facility]: [phone]      

## 2022-11-22 ENCOUNTER — Encounter: Payer: Self-pay | Admitting: Psychiatry

## 2022-11-22 ENCOUNTER — Telehealth (INDEPENDENT_AMBULATORY_CARE_PROVIDER_SITE_OTHER): Payer: 59 | Admitting: Psychiatry

## 2022-11-22 DIAGNOSIS — F411 Generalized anxiety disorder: Secondary | ICD-10-CM | POA: Diagnosis not present

## 2022-11-22 DIAGNOSIS — F3176 Bipolar disorder, in full remission, most recent episode depressed: Secondary | ICD-10-CM | POA: Diagnosis not present

## 2022-11-22 DIAGNOSIS — Z79899 Other long term (current) drug therapy: Secondary | ICD-10-CM

## 2022-11-22 MED ORDER — LAMOTRIGINE 100 MG PO TABS
50.0000 mg | ORAL_TABLET | Freq: Two times a day (BID) | ORAL | 0 refills | Status: DC
Start: 2022-11-22 — End: 2023-03-07

## 2022-11-22 MED ORDER — FLUOXETINE HCL 20 MG PO CAPS: 20.0000 mg | ORAL_CAPSULE | Freq: Every day | ORAL | 0 refills | Status: DC

## 2022-11-22 MED ORDER — CARIPRAZINE HCL 1.5 MG PO CAPS: ORAL_CAPSULE | ORAL | 2 refills | Status: DC

## 2022-11-22 NOTE — Progress Notes (Signed)
Virtual Visit via Video Note  I connected with Suzanne Boyd on 11/22/22 at 11:40 AM EDT by a video enabled telemedicine application and verified that I am speaking with the correct person using two identifiers.  Location Provider Location : Remote Office  Patient Location : Home  Participants: Patient , Provider    I discussed the limitations of evaluation and management by telemedicine and the availability of in person appointments. The patient expressed understanding and agreed to proceed.  I discussed the assessment and treatment plan with the patient. The patient was provided an opportunity to ask questions and all were answered. The patient agreed with the plan and demonstrated an understanding of the instructions.   The patient was advised to call back or seek an in-person evaluation if the symptoms worsen or if the condition fails to improve as anticipated.                                                                                        BH MD OP Progress Note  11/22/2022 1:27 PM Suzanne Boyd  MRN:  [id number]  Chief Complaint:  Chief Complaint  Patient presents with   Follow-up   Depression   Anxiety   Medication Refill   HPI: Suzanne Boyd is a 32 year old Caucasian female, employed, divorced, lives in [place], has a history of bipolar disorder, GAD, insomnia, restless legs syndrome was evaluated by telemedicine today.  Patient today reports she is currently doing okay with regards to her mood.  Patient denies any significant mood swings irritability, sadness or manic symptoms.  Reports work is going fairly well.  She continues to work third shift however is planning to move to first shift soon.  She wants to know how that will go .  Hopefully she will be able to do more things during the day after her shift ends.  Patient reports sleep as good most nights.  However couple of times a week she has difficulty falling asleep.  She does not take the trazodone every  night and taking trazodone helps her to fall asleep in 30 minutes to an hour.  Patient denies any significant anxiety or panic attacks.  Although has hydroxyzine and propranolol as needed available has not used it much.  Patient denies any side effects to medications.   Patient denies any suicidality, homicidality or perceptual disturbances.  Patient denies any other concerns today.  Visit Diagnosis:    ICD-10-CM   1. Bipolar disorder, in full remission, most recent episode depressed (HCC)  F31.76 lamoTRIgine (LAMICTAL) 100 MG tablet   Type 1    2. GAD (generalized anxiety disorder)  F41.1 Lipid panel    Prolactin    Hemoglobin A1C    TSH    cariprazine (VRAYLAR) 1.5 MG capsule    FLUoxetine (PROZAC) 20 MG capsule    3. High risk medication use  Z79.899 Lipid panel    Prolactin    Hemoglobin A1C    TSH      Past Psychiatric History: I have reviewed past psychiatric history from progress note on 10/25/2017.  Past trials of Paxil, Lexapro, Abilify, risperidone, Wellbutrin.  Past Medical History:  Past  Medical History:  Diagnosis Date   Anxiety    BRCA negative 10/2019   MyRIsk neg   Depression    Family history of breast cancer    Increased risk of breast cancer 10/2019   IBIS=19.4%/riskscore=22.2%   LGSIL on Pap smear of cervix     Past Surgical History:  Procedure Laterality Date   INTRAUTERINE DEVICE (IUD) INSERTION      Family Psychiatric History: I have reviewed family psychiatric history from progress note on 10/25/2017.  Family History:  Family History  Problem Relation Age of Onset   Hyperlipidemia Mother    Hypertension Mother    Varicose Veins Mother    Hypertension Father    Thyroid disease Father    Healthy Brother    Bipolar disorder Maternal Aunt    Cancer - Colon Maternal Uncle    Thyroid disease Maternal Grandfather    Cancer Maternal Grandmother 56       Breast   Depression Maternal Grandmother    Brain cancer Maternal Grandmother     Cancer Other 53       breast   Anxiety disorder Other    Cancer Other 55       breast   Cancer Other 55       breast   Cancer Other 55       breast   Cancer Other 60       breast   Cancer Other 60       breast   Cancer Other 60       breast   Ovarian cancer Neg Hx     Social History: I have reviewed social history from progress note on 10/25/2017. Social History   Socioeconomic History   Marital status: Divorced    Spouse name: Not on file   Number of children: 0   Years of education: 12   Highest education level: High school graduate  Occupational History   Occupation: Air cabin crew    Comment: full time  Tobacco Use   Smoking status: Never   Smokeless tobacco: Never  Vaping Use   Vaping Use: Never used  Substance and Sexual Activity   Alcohol use: Not Currently    Comment: occasional- 1 drink per month   Drug use: Yes    Types: Marijuana    Comment: last used 3 weeks ago   Sexual activity: Yes    Partners: Male    Birth control/protection: I.U.D.    Comment: Kyleena  Other Topics Concern   Not on file  Social History Narrative   Not on file   Social Determinants of Health   Financial Resource Strain: Low Risk  (10/25/2017)   Overall Financial Resource Strain (CARDIA)    Difficulty of Paying Living Expenses: Not hard at all  Food Insecurity: No Food Insecurity (10/25/2017)   Hunger Vital Sign    Worried About Running Out of Food in the Last Year: Never true    Ran Out of Food in the Last Year: Never true  Transportation Needs: No Transportation Needs (10/25/2017)   PRAPARE - Administrator, Civil Service (Medical): No    Lack of Transportation (Non-Medical): No  Physical Activity: Inactive (07/04/2017)   Exercise Vital Sign    Days of Exercise per Week: 0 days    Minutes of Exercise per Session: 0 min  Stress: Stress Concern Present (07/04/2017)   Suzanne Boyd of Occupational Health - Occupational Stress Questionnaire    Feeling of  Stress :  To some extent  Social Connections: Moderately Isolated (10/25/2017)   Social Connection and Isolation Panel [NHANES]    Frequency of Communication with Friends and Family: Once a week    Frequency of Social Gatherings with Friends and Family: Once a week    Attends Religious Services: Never    Database administrator or Organizations: No    Attends Engineer, structural: Never    Marital Status: Married    Allergies: No Known Allergies  Metabolic Disorder Labs: Lab Results  Component Value Date   HGBA1C 5.7 (H) 04/21/2021   Lab Results  Component Value Date   PROLACTIN 15.3 03/29/2022   Lab Results  Component Value Date   CHOL 163 04/21/2021   TRIG 89 04/21/2021   HDL 46 04/21/2021   CHOLHDL 3.5 04/21/2021   LDLCALC 100 (H) 04/21/2021   LDLCALC 86 08/29/2017   Lab Results  Component Value Date   TSH 1.514 07/21/2021   TSH 1.100 11/30/2018    Therapeutic Level Labs: No results found for: "LITHIUM" No results found for: "VALPROATE" No results found for: "CBMZ"  Current Medications: Current Outpatient Medications  Medication Sig Dispense Refill   hydrOXYzine (ATARAX) 10 MG tablet TAKE 1 TABLET(10 MG) BY MOUTH TWICE DAILY AS NEEDED FOR ANXIETY OR SEVERE ANXIETY 60 tablet 1   levonorgestrel (KYLEENA) 19.5 MG IUD 1 each by Intrauterine route once.     propranolol (INDERAL) 10 MG tablet TAKE 1 TABLET BY MOUTH THREE TIMES DAILY AS NEEDED FOR RESTLESNESS AND ANXIETY 270 tablet 0   traZODone (DESYREL) 50 MG tablet TAKE 1 AND 1/2 TO 2 TABLETS(75 TO 100 MG) BY MOUTH AT BEDTIME AS NEEDED FOR SLEEP 60 tablet 2   cariprazine (VRAYLAR) 1.5 MG capsule TAKE 1 CAPSULE(1.5 MG) BY MOUTH DAILY 30 capsule 2   FLUoxetine (PROZAC) 20 MG capsule Take 1 capsule (20 mg total) by mouth daily with breakfast. 90 capsule 0   lamoTRIgine (LAMICTAL) 100 MG tablet Take 0.5 tablets (50 mg total) by mouth 2 (two) times daily. 90 tablet 0   No current facility-administered medications  for this visit.     Musculoskeletal: Strength & Muscle Tone:  UTA Gait & Station:  Seated Patient leans: N/A  Psychiatric Specialty Exam: Review of Systems  Psychiatric/Behavioral:  Positive for sleep disturbance.     There were no vitals taken for this visit.There is no height or weight on file to calculate BMI.  General Appearance: Fairly Groomed  Eye Contact:  Fair  Speech:  Clear and Coherent  Volume:  Normal  Mood:  Euthymic  Affect:  Congruent  Thought Process:  Goal Directed and Descriptions of Associations: Intact  Orientation:  Full (Time, Place, and Person)  Thought Content: Logical   Suicidal Thoughts:  No  Homicidal Thoughts:  No  Memory:  Immediate;   Fair Recent;   Fair Remote;   Fair  Judgement:  Fair  Insight:  Fair  Psychomotor Activity:  Normal  Concentration:  Concentration: Fair and Attention Span: Fair  Recall:  [employer] of Knowledge: Fair  Language: Fair  Akathisia:  No  Handed:  Right  AIMS (if indicated): not done  Assets:  Communication Skills Desire for Improvement Housing Social Support  ADL's:  Intact  Cognition: WNL  Sleep:   Restless at times   Screenings: AIMS    Flowsheet Row Office Visit from 08/23/2022 in [facility] Office Visit from 05/24/2022 in [facility] Office  Visit from 03/29/2022 in [facility] Office Visit from 11/27/2021 in [facility] Office Visit from 09/18/2021 in [facility]  AIMS Total Score 0 0 0 0 0      GAD-7    Flowsheet Row Office Visit from 08/23/2022 in [facility] Office Visit from 05/24/2022 in [facility] Office Visit from 03/29/2022 in [facility] Office Visit from 11/27/2021 in [facility] Office Visit from 08/07/2021 in [facility]  Total GAD-7 Score 4 4 10 16 11       PHQ2-9    Flowsheet Row Office Visit from 08/23/2022 in [facility] Office Visit from 05/24/2022 in [facility] Office Visit from 03/29/2022 in [facility] Office Visit from 11/27/2021 in [facility] Counselor from 09/23/2021 in [facility]  PHQ-2 Total Score 2 1 2 3 6   PHQ-9 Total Score 7 4 8 12 23       Flowsheet Row Video Visit from 11/22/2022 in [facility] Office Visit from 08/23/2022 in [facility] Office Visit from 05/24/2022 in [facility]  C-SSRS RISK CATEGORY No Risk No Risk No Risk        Assessment and Plan: Raven Groebner is a 31 year old Caucasian female who has a history of bipolar disorder, GAD was evaluated by telemedicine today.  Patient is currently stable although noncompliant on lamotrigine.  Plan as noted below.  Plan Bipolar disorder type I depressed in full remission Vraylar 1.5 mg p.o. daily Continue lamotrigine 100 mg p.o. daily in divided dosage.  Patient advised to clarify this dosage by reviewing her prescription bottle at home and let writer know. Trazodone 75-100 mg p.o. nightly as needed  GAD-stable Prozac 20 mg p.o. daily. Hydroxyzine 25 mg p.o. twice daily as needed for severe anxiety Propranolol 10 mg p.o. daily as needed for severe anxiety attacks  High risk medication use-will order labs since patient is on atypical antipsychotic/Vraylar-lipid panel, hemoglobin A1c, prolactin as well as will order TSH since the last 1 was done more than a year ago in a patient with mood  symptoms/anxiety.  Patient to go to [facility] lab.  Follow-up in clinic in 3 to 4 months or sooner in person. Consent: Patient/Guardian gives verbal consent for treatment and assignment of benefits for services provided during this visit. Patient/Guardian expressed understanding and agreed to proceed.   This note was generated in part or whole with voice recognition software. Voice recognition is usually quite accurate but there are transcription errors that can and very often do occur. I apologize for any typographical errors that were not detected and corrected.      Jomarie Longs, MD 11/22/2022, 1:27 PM

## 2023-02-04 ENCOUNTER — Encounter: Payer: Self-pay | Admitting: Emergency Medicine

## 2023-02-04 ENCOUNTER — Ambulatory Visit
Admission: EM | Admit: 2023-02-04 | Discharge: 2023-02-04 | Disposition: A | Discharge status: Home / Self Care | Payer: 59 | Attending: Emergency Medicine | Admitting: Emergency Medicine

## 2023-02-04 DIAGNOSIS — U071 COVID-19: Secondary | ICD-10-CM | POA: Diagnosis present

## 2023-02-04 LAB — SARS CORONAVIRUS 2 BY RT PCR: SARS Coronavirus 2 by RT PCR: POSITIVE — AB

## 2023-02-04 MED ORDER — PROMETHAZINE-DM 6.25-15 MG/5ML PO SYRP: 5.0000 mL | ORAL_SOLUTION | Freq: Four times a day (QID) | ORAL | 0 refills | Status: DC | PRN

## 2023-02-04 MED ORDER — IPRATROPIUM BROMIDE 0.06 % NA SOLN: 2.0000 | Freq: Four times a day (QID) | NASAL | 12 refills | Status: AC

## 2023-02-04 MED ORDER — BENZONATATE 100 MG PO CAPS: 200.0000 mg | ORAL_CAPSULE | Freq: Three times a day (TID) | ORAL | 0 refills | Status: DC

## 2023-02-04 NOTE — ED Provider Notes (Signed)
[facility]    CSN: [id number] Arrival date & time: 02/04/23  1014      History   Chief Complaint Chief Complaint  Patient presents with   Covid Positive   Cough    HPI Suzanne Boyd is a 32 y.o. female.   HPI  32 year old female with a past medical history significant for generalized anxiety disorder, bipolar 2 disorder, restless leg, and prolonged QT syndrome presents for evaluation of respiratory symptoms which began 3 days ago.  Took a home COVID test last night that was positive.  She is here requesting a note for work.  Past Medical History:  Diagnosis Date   Anxiety    BRCA negative 10/2019   MyRIsk neg   Depression    Family history of breast cancer    Increased risk of breast cancer 10/2019   IBIS=19.4%/riskscore=22.2%   LGSIL on Pap smear of cervix     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   Passive suicidal ideations 04/21/2021   Bipolar 2 disorder, major depressive episode (HCC) 04/17/2020   GAD (generalized anxiety disorder) 04/17/2020   Insomnia 04/17/2020   Family history of breast cancer 10/30/2019   Family history of diabetes mellitus 08/29/2017   Overweight 07/04/2017    Past Surgical History:  Procedure Laterality Date   INTRAUTERINE DEVICE (IUD) INSERTION      OB History     Gravida  0   Para  0   Term  0   Preterm  0   AB  0   Living  0      SAB  0    IAB  0   Ectopic  0   Multiple  0   Live Births               Home Medications    Prior to Admission medications   Medication Sig Start Date End Date Taking? Authorizing Provider  benzonatate (TESSALON) 100 MG capsule Take 2 capsules (200 mg total) by mouth every 8 (eight) hours. 02/04/23  Yes Becky Augusta, NP  FLUoxetine (PROZAC) 20 MG capsule Take 1 capsule (20 mg total) by mouth daily with breakfast. 11/22/22  Yes Eappen, Saramma, MD  ipratropium (ATROVENT) 0.06 % nasal spray Place 2 sprays into both nostrils 4 (four) times daily. 02/04/23  Yes Becky Augusta, NP  lamoTRIgine (LAMICTAL) 100 MG tablet Take 0.5 tablets (50 mg total) by mouth 2 (two) times daily. 11/22/22  Yes Jomarie Longs, MD  levonorgestrel (KYLEENA) 19.5 MG IUD 1 each by Intrauterine route once. 08/17/22  Yes [provider]  promethazine-dextromethorphan (PROMETHAZINE-DM) 6.25-15 MG/5ML syrup Take 5 mLs by mouth 4 (four) times daily as needed. 02/04/23  Yes Becky Augusta, NP  propranolol (INDERAL) 10 MG tablet TAKE 1 TABLET BY MOUTH THREE TIMES DAILY AS NEEDED FOR RESTLESNESS AND ANXIETY 12/24/21  Yes Eappen, Levin Bacon, MD  traZODone (DESYREL) 50 MG tablet TAKE 1 AND 1/2 TO 2 TABLETS(75  TO 100 MG) BY MOUTH AT BEDTIME AS NEEDED FOR SLEEP 09/03/22  Yes Eappen, Levin Bacon, MD  cariprazine (VRAYLAR) 1.5 MG capsule TAKE 1 CAPSULE(1.5 MG) BY MOUTH DAILY 11/22/22   Jomarie Longs, MD  hydrOXYzine (ATARAX) 10 MG tablet TAKE 1 TABLET(10 MG) BY MOUTH TWICE DAILY AS NEEDED FOR ANXIETY OR SEVERE ANXIETY 08/23/22   Jomarie Longs, MD    Family History Family History  Problem Relation Age of Onset   Hyperlipidemia Mother    Hypertension Mother    Varicose Veins Mother    Hypertension Father    Thyroid disease Father    Healthy Brother    Bipolar disorder Maternal Aunt    Cancer - Colon Maternal Uncle    Thyroid disease Maternal Grandfather    Cancer Maternal Grandmother 60       Breast   Depression Maternal Grandmother     Brain cancer Maternal Grandmother    Cancer Other 55       breast   Anxiety disorder Other    Cancer Other 55       breast   Cancer Other 55       breast   Cancer Other 55       breast   Cancer Other 60       breast   Cancer Other 60       breast   Cancer Other 60       breast   Ovarian cancer Neg Hx     Social History Social History   Tobacco Use   Smoking status: Never   Smokeless tobacco: Never  Vaping Use   Vaping status: Never Used  Substance Use Topics   Alcohol use: Not Currently    Comment: occasional- 1 drink per month   Drug use: Yes    Types: Marijuana    Comment: last used 3 weeks ago     Allergies   Patient has no known allergies.   Review of Systems Review of Systems  Constitutional:  Positive for fever.  HENT:  Positive for congestion, rhinorrhea and sore throat. Negative for ear pain.   Respiratory:  Positive for cough. Negative for shortness of breath and wheezing.      Physical Exam Triage Vital Signs ED Triage Vitals  Encounter Vitals Group     BP 02/04/23 1059 119/80     Systolic BP Percentile --      Diastolic BP Percentile --      Pulse Rate 02/04/23 1059 (!) 53     Resp 02/04/23 1059 15     Temp 02/04/23 1059 98.5 F (36.9 C)     Temp Source 02/04/23 1059 Oral     SpO2 02/04/23 1059 99 %     Weight 02/04/23 1058 184 lb 1.4 oz (83.5 kg)     Height 02/04/23 1058 5\' 3"  (1.6 m)     Head Circumference --      Peak Flow --      Pain Score 02/04/23 1057 6     Pain Loc --      Pain Education --      Exclude from Growth Chart --    No data found.  Updated Vital Signs BP 119/80 (BP Location: Left Arm)   Pulse (!) 53   Temp 98.5 F (36.9 C) (Oral)   Resp 15   Ht 5\' 3"  (1.6 m)   Wt 184 lb 1.4 oz (83.5 kg)   SpO2 99%   BMI 32.61 kg/m  Visual Acuity Right Eye Distance:   Left Eye Distance:   Bilateral Distance:    Right Eye Near:   Left Eye Near:    Bilateral Near:     Physical Exam Vitals and nursing note  reviewed.  Constitutional:      Appearance: Normal appearance. She is not ill-appearing.  HENT:     Head: Normocephalic and atraumatic.     Right Ear: Tympanic membrane, ear canal and external ear normal. There is no impacted cerumen.     Left Ear: Tympanic membrane, ear canal and external ear normal. There is no impacted cerumen.     Nose: Congestion and rhinorrhea present.     Comments: Mucosa is erythematous and edematous with scant clear discharge in both nares.    Mouth/Throat:     Mouth: Mucous membranes are moist.     Pharynx: Oropharynx is clear. Posterior oropharyngeal erythema present. No oropharyngeal exudate.     Comments: Mild erythema to the posterior oropharynx with clear postnasal drip. Cardiovascular:     Rate and Rhythm: Normal rate and regular rhythm.     Pulses: Normal pulses.     Heart sounds: Normal heart sounds. No murmur heard.    No friction rub. No gallop.  Pulmonary:     Effort: Pulmonary effort is normal.     Breath sounds: Normal breath sounds. No wheezing, rhonchi or rales.  Musculoskeletal:     Cervical back: Normal range of motion and neck supple. No tenderness.  Lymphadenopathy:     Cervical: No cervical adenopathy.  Skin:    General: Skin is warm and dry.     Capillary Refill: Capillary refill takes less than 2 seconds.     Findings: No rash.  Neurological:     General: No focal deficit present.     Mental Status: She is alert and oriented to person, place, and time.      UC Treatments / Results  Labs (all labs ordered are listed, but only abnormal results are displayed) Labs Reviewed  SARS CORONAVIRUS 2 BY RT PCR - Abnormal; Notable for the following components:      Result Value   SARS Coronavirus 2 by RT PCR POSITIVE (*)    All other components within normal limits    EKG   Radiology No results found.  Procedures Procedures (including critical care time)  Medications Ordered in UC Medications - No data to display  Initial  Impression / Assessment and Plan / UC Course  I have reviewed the triage vital signs and the nursing notes.  Pertinent labs & imaging results that were available during my care of the patient were reviewed by me and considered in my medical decision making (see chart for details).   Patient is a nontoxic-appearing 32 year old female presenting with request for work note after testing COVID-positive yesterday at home.  She has had symptoms for the last 3 days.  She is endorsing a subjective fever on the first and second day of symptoms but she has not run a fever in the last day.  She is afebrile here in clinic with an oral temp of 98.5.  Discussed the current CDC guidelines as far as quarantine goes.  She should mask around others for the first 5 days of symptoms and only quarantine if she has an active fever of 100.4 or higher.  Confirmatory COVID PCR was collected at triage and is positive.  I will discharge her home with a diagnosis of COVID-19 with prescription for Atrovent  nasal spray, Tessalon Perles, and Promethazine DM cough syrup.  I will also give her a work note for today and tomorrow.  As of Monday she will be on day 6 of symptoms and no longer infectious.   Final Clinical Impressions(s) / UC Diagnoses   Final diagnoses:  COVID-19     Discharge Instructions      CDC guidelines state that you must wear a mask for the first 5 days of symptoms when you are around other people.  After 5 days you no longer need to mask as you are no longer considered infectious.  There is no longer need to quarantine unless you have a fever.  If you do have a fever then you need to quarantine until you have been fever free for 24 hours without taking Tylenol and/or ibuprofen.  Use over-the-counter Tylenol and/or ibuprofen according to the package instructions as needed for fever and pain.  Use the Atrovent nasal spray, 2 squirts up each nostril every 6 hours, as needed for nasal congestion and runny  nose.  Use the Tessalon Perles every 8 hours during the day as needed for cough.  Take them with a small sip of water.  You may experience numbness to the base of your tongue or metallic taste in her mouth, this is normal.  Use the Promethazine DM cough syrup at bedtime as needed for cough and congestion.  Be mindful this medication will make you sleepy.  If you develop any worsening respiratory symptoms such as shortness of breath, shortness of breath at rest, feel as though you cannot catch your breath, you are unable to speak in full sentences, or, as a late sign, your lips begin turning blue you need to call 911 and go to the ER for evaluation.      ED Prescriptions     Medication Sig Dispense Auth. Provider   benzonatate (TESSALON) 100 MG capsule Take 2 capsules (200 mg total) by mouth every 8 (eight) hours. 21 capsule Becky Augusta, NP   ipratropium (ATROVENT) 0.06 % nasal spray Place 2 sprays into both nostrils 4 (four) times daily. 15 mL Becky Augusta, NP   promethazine-dextromethorphan (PROMETHAZINE-DM) 6.25-15 MG/5ML syrup Take 5 mLs by mouth 4 (four) times daily as needed. 118 mL Becky Augusta, NP      PDMP not reviewed this encounter.   Becky Augusta, NP 02/04/23 1155

## 2023-02-04 NOTE — Discharge Instructions (Signed)
CDC guidelines state that you must wear a mask for the first 5 days of symptoms when you are around other people.  After 5 days you no longer need to mask as you are no longer considered infectious.  There is no longer need to quarantine unless you have a fever.  If you do have a fever then you need to quarantine until you have been fever free for 24 hours without taking Tylenol and/or ibuprofen.  Use over-the-counter Tylenol and/or ibuprofen according to the package instructions as needed for fever and pain.  Use the Atrovent nasal spray, 2 squirts up each nostril every 6 hours, as needed for nasal congestion and runny nose.  Use the Tessalon Perles every 8 hours during the day as needed for cough.  Take them with a small sip of water.  You may experience numbness to the base of your tongue or metallic taste in her mouth, this is normal.  Use the Promethazine DM cough syrup at bedtime as needed for cough and congestion.  Be mindful this medication will make you sleepy.  If you develop any worsening respiratory symptoms such as shortness of breath, shortness of breath at rest, feel as though you cannot catch your breath, you are unable to speak in full sentences, or, as a late sign, your lips begin turning blue you need to call 911 and go to the ER for evaluation.

## 2023-02-04 NOTE — ED Triage Notes (Signed)
Patient states that she did a home COVID test last night and was positive.  Patient will need a work note.  Patient reports cough, runny nose, and headache that started on Tuesday.

## 2023-03-07 ENCOUNTER — Other Ambulatory Visit
Admission: RE | Admit: 2023-03-07 | Discharge: 2023-03-07 | Disposition: A | Discharge status: Home / Self Care | Payer: 59 | Source: Ambulatory Visit | Attending: Psychiatry | Admitting: Psychiatry

## 2023-03-07 ENCOUNTER — Encounter: Payer: Self-pay | Admitting: Psychiatry

## 2023-03-07 ENCOUNTER — Ambulatory Visit (INDEPENDENT_AMBULATORY_CARE_PROVIDER_SITE_OTHER): Payer: 59 | Admitting: Psychiatry

## 2023-03-07 VITALS — BP 119/84 | HR 73 | Temp 96.3°F | Ht 63.0 in | Wt 170.8 lb

## 2023-03-07 DIAGNOSIS — F411 Generalized anxiety disorder: Secondary | ICD-10-CM | POA: Insufficient documentation

## 2023-03-07 DIAGNOSIS — Z79899 Other long term (current) drug therapy: Secondary | ICD-10-CM | POA: Diagnosis present

## 2023-03-07 DIAGNOSIS — F3176 Bipolar disorder, in full remission, most recent episode depressed: Secondary | ICD-10-CM

## 2023-03-07 LAB — HEMOGLOBIN A1C
Hgb A1c MFr Bld: 5.4 % (ref 4.8–5.6)
Mean Plasma Glucose: 108.28 mg/dL

## 2023-03-07 LAB — LIPID PANEL
Cholesterol: 166 mg/dL (ref 0–200)
HDL: 58 mg/dL (ref >40)
LDL Cholesterol: 96 mg/dL (ref 0–99)
Total CHOL/HDL Ratio: 2.9 {ratio}
Triglycerides: 59 mg/dL (ref <150)
VLDL: 12 mg/dL (ref 0–40)

## 2023-03-07 LAB — TSH: TSH: 0.934 u[IU]/mL (ref 0.350–4.500)

## 2023-03-07 MED ORDER — CARIPRAZINE HCL 1.5 MG PO CAPS: ORAL_CAPSULE | ORAL | 0 refills | Status: DC

## 2023-03-07 MED ORDER — FLUOXETINE HCL 20 MG PO CAPS: 20.0000 mg | ORAL_CAPSULE | Freq: Every day | ORAL | 1 refills | Status: AC

## 2023-03-07 MED ORDER — TRAZODONE HCL 50 MG PO TABS: 75.0000 mg | ORAL_TABLET | Freq: Every evening | ORAL | 2 refills | Status: AC | PRN

## 2023-03-07 MED ORDER — LAMOTRIGINE 100 MG PO TABS: 50.0000 mg | ORAL_TABLET | Freq: Two times a day (BID) | ORAL | 0 refills | Status: DC

## 2023-03-07 NOTE — Progress Notes (Unsigned)
BH MD OP Progress Note  03/07/2023 10:56 AM Suzanne Boyd  MRN:  [id number]  Chief Complaint:  Chief Complaint  Patient presents with   Follow-up   Anxiety   Depression   Medication Refill   HPI: Suzanne Boyd is a 32 year old Caucasian female, employed, divorced, lives in [place], has a history of bipolar disorder, GAD, insomnia, restless leg syndrome was evaluated in office today.  Patient today reports she is overall doing fairly well.  Patient reports she currently does not feel depressed.  She is able to focus on her work.  Work is going well.  Patient reports sleep as good.  She may have lost a few pounds since she is cutting back on her portions.  Patient reports she is currently in a relationship with her boyfriend and that is going well.  She denies any mood swings, irritability or anxiety.  She denies any suicidality.  She denies any homicidality or perceptual disturbances.  Patient reports she is currently compliant on medications like Lamictal, fluoxetine, trazodone, Vraylar.  Agreeable to start weaning of Vraylar since she is currently doing fairly well.  Patient has been noncompliant with labs ordered in July.  Agrees to get it done today.  Patient denies any other concerns today.  Visit Diagnosis:    ICD-10-CM   1. Bipolar disorder, in full remission, most recent episode depressed (HCC)  F31.76 lamoTRIgine (LAMICTAL) 100 MG tablet   Type 1    2. GAD (generalized anxiety disorder)  F41.1 traZODone (DESYREL) 50 MG tablet    FLUoxetine (PROZAC) 20 MG capsule    cariprazine (VRAYLAR) 1.5 MG capsule    3. High risk medication use  Z79.899       Past Psychiatric History: I have reviewed past psychiatric history from progress note on 10/25/2017.  Past trials of Paxil, Lexapro, Abilify, risperidone, Wellbutrin.  Past Medical History:  Past Medical History:  Diagnosis Date   Anxiety    BRCA negative 10/2019   MyRIsk neg   Depression    Family history of breast  cancer    Increased risk of breast cancer 10/2019   IBIS=19.4%/riskscore=22.2%   LGSIL on Pap smear of cervix     Past Surgical History:  Procedure Laterality Date   INTRAUTERINE DEVICE (IUD) INSERTION      Family Psychiatric History: I have reviewed family psychiatric history from progress note on 10/25/2017.  Family History:  Family History  Problem Relation Age of Onset   Hyperlipidemia Mother    Hypertension Mother    Varicose Veins Mother    Hypertension Father    Thyroid disease Father    Healthy Brother    Bipolar disorder Maternal Aunt    Cancer - Colon Maternal Uncle    Thyroid disease Maternal Grandfather    Cancer Maternal Grandmother 65       Breast   Depression Maternal Grandmother    Brain cancer Maternal Grandmother    Cancer Other 56       breast   Anxiety disorder Other    Cancer Other 55       breast   Cancer Other 55       breast   Cancer Other 55       breast   Cancer Other 60       breast   Cancer Other 60       breast   Cancer Other 60       breast   Ovarian cancer Neg Hx  Social History: I have reviewed social history from progress note on 10/25/2017. Social History   Socioeconomic History   Marital status: Divorced    Spouse name: Not on file   Number of children: 0   Years of education: 12   Highest education level: High school graduate  Occupational History   Occupation: Air cabin crew    Comment: full time  Tobacco Use   Smoking status: Never   Smokeless tobacco: Never  Vaping Use   Vaping status: Never Used  Substance and Sexual Activity   Alcohol use: Not Currently    Comment: occasional- 1 drink per month   Drug use: Yes    Types: Marijuana    Comment: last used 3 weeks ago   Sexual activity: Yes    Partners: Male    Birth control/protection: I.U.D.    Comment: Suzanne Boyd  Other Topics Concern   Not on file  Social History Narrative   Not on file   Social Determinants of Health   Financial Resource Strain: Low  Risk  (10/25/2017)   Overall Financial Resource Strain (CARDIA)    Difficulty of Paying Living Expenses: Not hard at all  Food Insecurity: No Food Insecurity (10/25/2017)   Hunger Vital Sign    Worried About Running Out of Food in the Last Year: Never true    Ran Out of Food in the Last Year: Never true  Transportation Needs: No Transportation Needs (10/25/2017)   PRAPARE - Administrator, Civil Service (Medical): No    Lack of Transportation (Non-Medical): No  Physical Activity: Inactive (07/04/2017)   Exercise Vital Sign    Days of Exercise per Week: 0 days    Minutes of Exercise per Session: 0 min  Stress: Stress Concern Present (07/04/2017)   [employer] of Occupational Health - Occupational Stress Questionnaire    Feeling of Stress : To some extent  Social Connections: Moderately Isolated (10/25/2017)   Social Connection and Isolation Panel [NHANES]    Frequency of Communication with Friends and Family: Once a week    Frequency of Social Gatherings with Friends and Family: Once a week    Attends Religious Services: Never    Database administrator or Organizations: No    Attends Engineer, structural: Never    Marital Status: Married    Allergies: No Known Allergies  Metabolic Disorder Labs: Lab Results  Component Value Date   HGBA1C 5.4 03/07/2023   MPG 108.28 03/07/2023   Lab Results  Component Value Date   PROLACTIN 12.9 03/07/2023   PROLACTIN 15.3 03/29/2022   Lab Results  Component Value Date   CHOL 166 03/07/2023   TRIG 59 03/07/2023   HDL 58 03/07/2023   CHOLHDL 2.9 03/07/2023   VLDL 12 03/07/2023   LDLCALC 96 03/07/2023   LDLCALC 100 (H) 04/21/2021   Lab Results  Component Value Date   TSH 0.934 03/07/2023   TSH 1.514 07/21/2021    Therapeutic Level Labs: No results found for: "LITHIUM" No results found for: "VALPROATE" No results found for: "CBMZ"  Current Medications: Current Outpatient Medications  Medication Sig  Dispense Refill   hydrOXYzine (ATARAX) 10 MG tablet TAKE 1 TABLET(10 MG) BY MOUTH TWICE DAILY AS NEEDED FOR ANXIETY OR SEVERE ANXIETY 60 tablet 1   ipratropium (ATROVENT) 0.06 % nasal spray Place 2 sprays into both nostrils 4 (four) times daily. 15 mL 12   levonorgestrel (Suzanne Boyd) 19.5 MG IUD 1 each by Intrauterine route once.  propranolol (INDERAL) 10 MG tablet TAKE 1 TABLET BY MOUTH THREE TIMES DAILY AS NEEDED FOR RESTLESNESS AND ANXIETY 270 tablet 0   cariprazine (VRAYLAR) 1.5 MG capsule TAKE 1 CAPSULE(1.5 MG) BY MOUTH DAILY 30 capsule 0   FLUoxetine (PROZAC) 20 MG capsule Take 1 capsule (20 mg total) by mouth daily with breakfast. 90 capsule 1   lamoTRIgine (LAMICTAL) 100 MG tablet Take 0.5 tablets (50 mg total) by mouth 2 (two) times daily. 90 tablet 0   traZODone (DESYREL) 50 MG tablet Take 1.5-2 tablets (75-100 mg total) by mouth at bedtime as needed for sleep. 180 tablet 2   No current facility-administered medications for this visit.     Musculoskeletal: Strength & Muscle Tone: within normal limits Gait & Station: normal Patient leans: N/A  Psychiatric Specialty Exam: Review of Systems  Psychiatric/Behavioral: Negative.      Blood pressure 119/84, pulse 73, temperature (!) 96.3 F (35.7 C), temperature source Skin, height 5\' 3"  (1.6 m), weight 170 lb 12.8 oz (77.5 kg).Body mass index is 30.26 kg/m.  General Appearance: Casual  Eye Contact:  Fair  Speech:  Clear and Coherent  Volume:  Normal  Mood:  Euthymic  Affect:  Congruent  Thought Process:  Goal Directed and Descriptions of Associations: Intact  Orientation:  Full (Time, Place, and Person)  Thought Content: Logical   Suicidal Thoughts:  No  Homicidal Thoughts:  No  Memory:  Immediate;   Fair Recent;   Fair Remote;   Fair  Judgement:  Fair  Insight:  Fair  Psychomotor Activity:  Normal  Concentration:  Concentration: Fair and Attention Span: Fair  Recall:  [employer] of Knowledge: Fair  Language: Fair   Akathisia:  No  Handed:  Right  AIMS (if indicated): done  Assets:  Communication Skills Desire for Improvement Housing Social Support  ADL's:  Intact  Cognition: WNL  Sleep:  Fair   Screenings: Midwife Visit from 03/07/2023 in [facility] Office Visit from 08/23/2022 in [facility] Office Visit from 05/24/2022 in [facility] Office Visit from 03/29/2022 in [facility] Office Visit from 11/27/2021 in [facility]  AIMS Total Score 0 0 0 0 0      GAD-7    Flowsheet Row Office Visit from 03/07/2023 in [facility] Office Visit from 08/23/2022 in [facility] Office Visit from 05/24/2022 in [facility] Office Visit from 03/29/2022 in [facility] Office Visit from 11/27/2021 in [facility]  Total GAD-7 Score 3 4 4 10 16       PHQ2-9    Flowsheet Row Office Visit from 03/07/2023 in [facility] Office Visit from 08/23/2022 in [facility] Office Visit from 05/24/2022 in [facility] Office Visit from 03/29/2022 in [facility] Office Visit from 11/27/2021 in [facility]  PHQ-2 Total Score 1 2 1 2 3   PHQ-9 Total Score -- 7 4 8 12       Flowsheet Row Office Visit from 03/07/2023 in [facility] ED from 02/04/2023 in [facility]  Video Visit from 11/22/2022 in [facility]  C-SSRS  RISK  CATEGORY No Risk No Risk No Risk        Assessment and Plan: Breckyn Wenstrom is a 32 year old Caucasian female who has a history of bipolar disorder, GAD was evaluated in office today.  Patient is currently stable on the current medication regimen reports she is compliant on medications, agreeable to trial of tapering off of Vraylar due to concerns about long-term adverse side effects of atypical antipsychotics.  Plan as noted below.  Plan Bipolar disorder type I depressed in full remission Patient advised to start taking Vraylar 1.5 mg p.o. daily every other day for 2 weeks and stop taking it. Continue Lamictal 100 mg p.o. daily. Trazodone 75-100 mg p.o. nightly as needed for sleep  GAD-stable Prozac 20 mg p.o. daily Hydroxyzine 25 mg p.o. twice daily as needed for severe anxiety Propranolol 10 mg p.o. daily as needed for anxiety attacks  High risk medication use-patient has been noncompliant with labs ordered.  Patient encouraged to get labs [facility] lab-labs ordered lipid panel, hemoglobin A1c, prolactin, TSH.   Follow-up in clinic in 8 weeks or sooner if needed. Collaboration of Care: Collaboration of Care: Other patient agrees to get labs completed as ordered.  Patient/Guardian was advised Release of Information must be obtained prior to any record release in order to collaborate their care with an outside provider. Patient/Guardian was advised if they have not already done so to contact the registration department to sign all necessary forms in order for [place] to release information regarding their care.   Consent: Patient/Guardian gives verbal consent for treatment and assignment of benefits for services provided during this visit. Patient/Guardian expressed understanding and agreed to proceed.   This note was generated in part or whole with voice recognition software. Voice recognition is usually quite accurate but there are transcription errors that can and very often do occur. I  apologize for any typographical errors that were not detected and corrected.    Jomarie Longs, MD 03/08/2023, 8:50 AM

## 2023-03-07 NOTE — Patient Instructions (Signed)
Start taking Vraylar every other day for 2 weeks and stop taking.

## 2023-03-08 LAB — PROLACTIN: Prolactin: 12.9 ng/mL (ref 4.8–33.4)

## 2023-05-02 ENCOUNTER — Encounter: Payer: Self-pay | Admitting: Psychiatry

## 2023-05-02 ENCOUNTER — Telehealth (INDEPENDENT_AMBULATORY_CARE_PROVIDER_SITE_OTHER): Payer: 59 | Admitting: Psychiatry

## 2023-05-02 DIAGNOSIS — F411 Generalized anxiety disorder: Secondary | ICD-10-CM

## 2023-05-02 DIAGNOSIS — F3176 Bipolar disorder, in full remission, most recent episode depressed: Secondary | ICD-10-CM

## 2023-05-02 NOTE — Progress Notes (Signed)
Virtual Visit via Video Note  I connected with Suzanne Boyd on 05/02/23 at  3:30 PM EST by a video enabled telemedicine application and verified that I am speaking with the correct person using two identifiers.  Location Provider Location : [facility] Patient Location : Home  Participants: Patient , Provider    I discussed the limitations of evaluation and management by telemedicine and the availability of in person appointments. The patient expressed understanding and agreed to proceed.    I discussed the assessment and treatment plan with the patient. The patient was provided an opportunity to ask questions and all were answered. The patient agreed with the plan and demonstrated an understanding of the instructions.   The patient was advised to call back or seek an in-person evaluation if the symptoms worsen or if the condition fails to improve as anticipated.  BH MD OP Progress Note  05/03/2023 2:35 PM Suzanne Boyd  MRN:  [id number]  Chief Complaint:  Chief Complaint  Patient presents with   Follow-up   Anxiety   Medication Refill   HPI: Suzanne Boyd is a 32 year old Caucasian female, employed, divorced, lives in [place], has a history of bipolar disorder, GAD, insomnia, restless leg syndrome was evaluated by telemedicine today.   The patient, diagnosed with bipolar disorder type one and generalized anxiety disorder, has been managing well on her current medication regimen. She took Vraylar 1.5 mg every other day, and she stopped after two weeks . She continues to take Lamictal 100 mg daily as a mood stabilizer, Trazodone 75 to 100 mg as needed, Fluoxetine 20 mg for anxiety, and Hydroxyzine and Propranolol as needed.  The patient's recent lab results were satisfactory, with a good lipid panel, prolactin level, Hemoglobin A1c at 5.4, and thyroid at 0.934.  The patient reported a new symptom of intermittent elbow joint ache, primarily occurring at night and resolving within a few  minutes. She denied any history of arthritis or other medical issues, and no recent trauma to the area.   The patient denied any suicidal thoughts.she denies any use of cannabis or alcohol use. She reported good sleep, focus, and attention. She has been managing well at work and had a good Thanksgiving with family. She has no specific plans for the upcoming Christmas holidays.  Patient denies any other concerns today.  Visit Diagnosis:    ICD-10-CM   1. Bipolar disorder, in full remission, most recent episode depressed (HCC)  F31.76    Type 1    2. GAD (generalized anxiety disorder)  F41.1       Past Psychiatric History: I have reviewed past psychiatric history from progress note on 10/25/2017.  Past trials of Paxil, Lexapro, Abilify, risperidone, Wellbutrin.  Past Medical History:  Past Medical History:  Diagnosis Date   Anxiety    BRCA negative 10/2019   MyRIsk neg   Depression    Family history of breast cancer    Increased risk of breast cancer 10/2019   IBIS=19.4%/riskscore=22.2%   LGSIL on Pap smear of cervix     Past Surgical History:  Procedure Laterality Date   INTRAUTERINE DEVICE (IUD) INSERTION      Family Psychiatric History: I have reviewed family psychiatric history from progress note on 10/25/2017.  Family History:  Family History  Problem Relation Age of Onset   Hyperlipidemia Mother    Hypertension Mother    Varicose Veins Mother    Hypertension Father    Thyroid disease Father    Healthy Brother  Bipolar disorder Maternal Aunt    Cancer - Colon Maternal Uncle    Thyroid disease Maternal Grandfather    Cancer Maternal Grandmother 34       Breast   Depression Maternal Grandmother    Brain cancer Maternal Grandmother    Cancer Other 93       breast   Anxiety disorder Other    Cancer Other 55       breast   Cancer Other 55       breast   Cancer Other 55       breast   Cancer Other 60       breast   Cancer Other 60       breast   Cancer  Other 60       breast   Ovarian cancer Neg Hx     Social History: I have reviewed social history from progress note on 10/25/2017. Social History   Socioeconomic History   Marital status: Divorced    Spouse name: Not on file   Number of children: 0   Years of education: 12   Highest education level: High school graduate  Occupational History   Occupation: Air cabin crew    Comment: full time  Tobacco Use   Smoking status: Never   Smokeless tobacco: Never  Vaping Use   Vaping status: Never Used  Substance and Sexual Activity   Alcohol use: Not Currently    Comment: occasional- 1 drink per month   Drug use: Yes    Types: Marijuana    Comment: last used 3 weeks ago   Sexual activity: Yes    Partners: Male    Birth control/protection: I.U.D.    Comment: [place]  Other Topics Concern   Not on file  Social History Narrative   Not on file   Social Drivers of Health   Financial Resource Strain: Low Risk  (10/25/2017)   Overall Financial Resource Strain (CARDIA)    Difficulty of Paying Living Expenses: Not hard at all  Food Insecurity: No Food Insecurity (10/25/2017)   Hunger Vital Sign    Worried About Running Out of Food in the Last Year: Never true    Ran Out of Food in the Last Year: Never true  Transportation Needs: No Transportation Needs (10/25/2017)   PRAPARE - Administrator, Civil Service (Medical): No    Lack of Transportation (Non-Medical): No  Physical Activity: Inactive (07/04/2017)   Exercise Vital Sign    Days of Exercise per Week: 0 days    Minutes of Exercise per Session: 0 min  Stress: Stress Concern Present (07/04/2017)   [employer] of Occupational Health - Occupational Stress Questionnaire    Feeling of Stress : To some extent  Social Connections: Moderately Isolated (10/25/2017)   Social Connection and Isolation Panel [NHANES]    Frequency of Communication with Friends and Family: Once a week    Frequency of Social Gatherings with  Friends and Family: Once a week    Attends Religious Services: Never    Database administrator or Organizations: No    Attends Banker Meetings: Never    Marital Status: Married    Allergies: No Known Allergies  Metabolic Disorder Labs: Lab Results  Component Value Date   HGBA1C 5.4 03/07/2023   MPG 108.28 03/07/2023   Lab Results  Component Value Date   PROLACTIN 12.9 03/07/2023   PROLACTIN 15.3 03/29/2022   Lab Results  Component Value Date  CHOL 166 03/07/2023   TRIG 59 03/07/2023   HDL 58 03/07/2023   CHOLHDL 2.9 03/07/2023   VLDL 12 03/07/2023   LDLCALC 96 03/07/2023   LDLCALC 100 (H) 04/21/2021   Lab Results  Component Value Date   TSH 0.934 03/07/2023   TSH 1.514 07/21/2021    Therapeutic Level Labs: No results found for: "LITHIUM" No results found for: "VALPROATE" No results found for: "CBMZ"  Current Medications: Current Outpatient Medications  Medication Sig Dispense Refill   FLUoxetine (PROZAC) 20 MG capsule Take 1 capsule (20 mg total) by mouth daily with breakfast. 90 capsule 1   hydrOXYzine (ATARAX) 10 MG tablet TAKE 1 TABLET(10 MG) BY MOUTH TWICE DAILY AS NEEDED FOR ANXIETY OR SEVERE ANXIETY 60 tablet 1   ipratropium (ATROVENT) 0.06 % nasal spray Place 2 sprays into both nostrils 4 (four) times daily. 15 mL 12   lamoTRIgine (LAMICTAL) 100 MG tablet Take 0.5 tablets (50 mg total) by mouth 2 (two) times daily. 90 tablet 0   levonorgestrel (KYLEENA) 19.5 MG IUD 1 each by Intrauterine route once.     propranolol (INDERAL) 10 MG tablet TAKE 1 TABLET BY MOUTH THREE TIMES DAILY AS NEEDED FOR RESTLESNESS AND ANXIETY 270 tablet 0   traZODone (DESYREL) 50 MG tablet Take 1.5-2 tablets (75-100 mg total) by mouth at bedtime as needed for sleep. 180 tablet 2   No current facility-administered medications for this visit.     Musculoskeletal: Strength & Muscle Tone:  UTA Gait & Station:  Seated Patient leans: N/A  Psychiatric Specialty  Exam: Review of Systems  Psychiatric/Behavioral: Negative.      There were no vitals taken for this visit.There is no height or weight on file to calculate BMI.  General Appearance: Fairly Groomed  Eye Contact:  Fair  Speech:  Clear and Coherent  Volume:  Normal  Mood:  Euthymic  Affect:  Congruent  Thought Process:  Goal Directed and Descriptions of Associations: Intact  Orientation:  Full (Time, Place, and Person)  Thought Content: Logical   Suicidal Thoughts:  No  Homicidal Thoughts:  No  Memory:  Immediate;   Fair Recent;   Fair Remote;   Fair  Judgement:  Fair  Insight:  Fair  Psychomotor Activity:  Normal  Concentration:  Concentration: Fair and Attention Span: Fair  Recall:  [employer] of Knowledge: Fair  Language: Fair  Akathisia:  No  Handed:  Right  AIMS (if indicated): not done  Assets:  Communication Skills Desire for Improvement Housing Social Support  ADL's:  Intact  Cognition: WNL  Sleep:  Fair   Screenings: Midwife Visit from 03/07/2023 in [facility] Office Visit from 08/23/2022 in [facility] Office Visit from 05/24/2022 in [facility] Office Visit from 03/29/2022 in [facility] Office Visit from 11/27/2021 in [facility]  AIMS Total Score 0 0 0 0 0      GAD-7    Flowsheet Row Office Visit from 03/07/2023 in [facility] Office Visit from 08/23/2022 in [facility] Office Visit from 05/24/2022 in [facility] Office Visit from 03/29/2022 in [facility] Office Visit from 11/27/2021 in [facility]  Total GAD-7 Score 3 4 4 10 16       PHQ2-9  Flowsheet Row Office Visit from 03/07/2023 in [facility] Office Visit from 08/23/2022 in [facility] Office Visit from 05/24/2022 in [facility] Office Visit from 03/29/2022 in [facility] Office Visit from 11/27/2021 in [facility]  PHQ-2 Total Score 1 2 1 2 3   PHQ-9 Total Score -- 7 4 8 12       Flowsheet Row Video Visit from 05/02/2023 in [facility] Office Visit from 03/07/2023 in [facility] ED from 02/04/2023 in [facility] [place]   C-SSRS RISK CATEGORY No Risk No Risk No Risk        Assessment and Plan: Suzanne Boyd is a 32 year old Caucasian female who has a history of bipolar disorder, GAD was evaluated in office today.  Patient is currently stable, discussed assessment and plan as noted below.   Bipolar Disorder Type I in remission Bipolar disorder type I, well-managed. Previously on Vraylar 1.5 mg every other day, discontinued due to stable condition. Continues on Lamictal 100 mg daily as a mood stabilizer. Discussed benefits of continuing Lamictal and risks of discontinuation, including mood destabilization. - Continue Lamictal 100 mg daily - Follow up in three months  Generalized Anxiety Disorder-stable Generalized anxiety disorder, well-managed with fluoxetine 20 mg daily, hydroxyzine, and propranolol as needed. No new symptoms reported. Discussed benefits of continuing current medications and risks of discontinuation, including increased anxiety symptoms. - Continue fluoxetine 20 mg daily - Continue hydroxyzine 25 mg twice a day as needed and propranolol 10 mg daily as needed - Follow up in three months  Intermittent Elbow Pain Intermittent elbow pain, described as aching,  mostly nocturnal, lasting a few minutes. No trauma or arthritis. Advised to evaluate if symptoms worsen. Discussed over-the-counter treatments and consulting primary care physician if symptoms persist. - Contact primary care physician if symptoms worsen - Consider over-the-counter treatments like Voltaren gel or Tylenol/Advil before bedtime if needed  General Health Maintenance Recent lab results including lipid panel, prolactin level, hemoglobin A1c, and thyroid function were within normal limits reviewed and discussed-dated.  03/07/2023. - Continue routine health maintenance and screenings  Follow-up - Follow up in three months on March 7th at 9:00 AM by video.  Collaboration of Care: Collaboration of Care: Primary Care Provider AEB patient encouraged/agrees to follow up with primary care provider.  Patient/Guardian was advised Release of Information must be obtained prior to any record release in order to collaborate their care with an outside provider. Patient/Guardian was advised if they have not already done so to contact the registration department to sign all necessary forms in order for [place] to release information regarding their care.   Consent: Patient/Guardian gives verbal consent for treatment and assignment of benefits for services provided during this visit. Patient/Guardian expressed understanding and agreed to proceed.   This note was generated in part or whole with voice recognition software. Voice recognition is usually quite accurate but there are transcription errors that can and very often do occur. I apologize for any typographical errors that were not detected and corrected.    Jomarie Longs, MD 05/03/2023, 2:35 PM

## 2023-05-31 ENCOUNTER — Ambulatory Visit: Payer: Self-pay

## 2023-05-31 NOTE — Telephone Encounter (Signed)
 Chief Complaint: Nipple discharge Symptoms: White thick left nipple discharge, left nipple pain Frequency: comes and goes Pertinent Negatives: Patient denies fever, bleeding from nipple.  Disposition: [] ED /[] Urgent Care (no appt availability in office) / [x] Appointment(In office/virtual)/ []  [facility]/ [] Home Care/ [] Refused Recommended Disposition /[] [facility]/ []  Follow-up with PCP Additional Notes: Patient stated she has noticed some discharge from the left nipple, onset about 1 week ago. Patient states the discharge is thick and white at times and other times it is clear. Patient also reports left nipple pain. Care advice was given and patient has been scheduled for an appointment with PCP on 06/07/23. Advised to callback if symptoms get worse, patient verbalized understanding.   Summary: Discharge from nipple   Pt called breast center to schedule mammogram. Pt noticed discharge coming from left nipple, pt states it has been going on for a week now. Pt informed by breast center to reach out to PCP to see if order can be called in or if pt needs appointment.  Pt seeking clinical advice.     Reason for Disposition  [1] Nipple discharge AND [2] not bloody (e.g., clear, white, yellow, brown, green)  Answer Assessment - Initial Assessment Questions 1. SYMPTOM: What's the main symptom you're concerned about?  (e.g., lump, pain, rash, nipple discharge)     Nipple discharge  2. LOCATION: Where is the discharge located?     Left nipple 3. ONSET: When did discharge  start?     1 week ago  4. PRIOR HISTORY: Do you have any history of prior problems with your breasts? (e.g., lumps, cancer, fibrocystic breast disease)     No  5. CAUSE: What do you think is causing this symptom?     I don't know  6. OTHER SYMPTOMS: Do you have any other symptoms? (e.g., fever, breast pain, redness or rash, nipple discharge)     White chucky discharge, nipple pain  7.  PREGNANCY-BREASTFEEDING: Is there any chance you are pregnant? When was your last menstrual period? Are you breastfeeding?     No, I am on birth control, I'm not breastfeeding  Protocols used: Breast Symptoms-A-AH

## 2023-06-07 ENCOUNTER — Ambulatory Visit (INDEPENDENT_AMBULATORY_CARE_PROVIDER_SITE_OTHER): Payer: BC Managed Care – PPO | Admitting: Family Medicine

## 2023-06-07 ENCOUNTER — Encounter: Payer: Self-pay | Admitting: Family Medicine

## 2023-06-07 VITALS — BP 114/73 | HR 55 | Ht 63.0 in | Wt 175.1 lb

## 2023-06-07 DIAGNOSIS — L0291 Cutaneous abscess, unspecified: Secondary | ICD-10-CM | POA: Diagnosis not present

## 2023-06-07 DIAGNOSIS — N61 Mastitis without abscess: Secondary | ICD-10-CM | POA: Diagnosis not present

## 2023-06-07 DIAGNOSIS — N6452 Nipple discharge: Secondary | ICD-10-CM

## 2023-06-07 MED ORDER — DOXYCYCLINE HYCLATE 100 MG PO TABS: 100.0000 mg | ORAL_TABLET | Freq: Two times a day (BID) | ORAL | 0 refills | Status: AC

## 2023-06-07 NOTE — Progress Notes (Signed)
Acute visit   Patient: Suzanne Boyd   DOB: March 20, 1991   33 y.o. Female  MRN: [id number]  Chief Complaint  Patient presents with   Breast Problem    Left nipple discharge X 1 week. Patient reports the discharge is thick and white at times and other times it is clear. Patient also reports left nipple pain. She does not take any form of contraceptive and is not breastfeeeding. Patient reports since calling in that she has noticed she has started to get a bump on the side so she placed warm compress on it and it busted. Bump appeared on other side and now it looks like an open sore. Pt reports previously having nipple piercing. Pain 7/10   Subjective    Discussed the use of AI scribe software for clinical note transcription with the patient, who gave verbal consent to proceed.  History of Present Illness   The patient presents with a week-long history of left nipple discharge and the formation of a large bump. The bump drained after the application of a hot rag, and a sore developed the following day. Another side of the nipple opened up after a shower, causing discomfort when it adhered to clothing. The patient reports itching and mild pain, suggesting a buildup in the area. There is no reported fever or systemic symptoms. The patient has a history of nipple piercings, but not recent, and the left one never fully closed. The patient is not pregnant, breastfeeding, or experiencing any other breast pain. This is the first occurrence of such symptoms.        Review of Systems  Objective    BP 114/73 (BP Location: Left Arm, Patient Position: Sitting, Cuff Size: Normal)   Pulse (!) 55   Ht 5\' 3"  (1.6 m)   Wt 175 lb 1.6 oz (79.4 kg)   SpO2 100%   BMI 31.02 kg/m  Physical Exam   Physical Exam   BREAST: Left nipple discharge, small ulceration over abscess medial to nipple, redness extending from nipple, red streak extending up the side of the breast, signs of mastitis, no lumps felt.        No results found for any visits on 06/07/23.  Assessment & Plan     Problem List Items Addressed This Visit   None Visit Diagnoses       Mastitis    -  Primary   Relevant Orders   MM 3D DIAGNOSTIC MAMMOGRAM UNILATERAL LEFT BREAST   US BREAST COMPLETE UNI LEFT INC AXILLA     Abscess       Relevant Orders   MM 3D DIAGNOSTIC MAMMOGRAM UNILATERAL LEFT BREAST   US BREAST COMPLETE UNI LEFT INC AXILLA     Nipple discharge           Assessment and Plan    Left Breast Abscess (Aerolar) with Mastitis Left nipple discharge and bumps for about a week, with subsequent formation of a sore after drainage. Previous nipple piercings with the left nipple not fully closed. No fever, systemic symptoms, or recent trauma. Physical exam reveals redness and a sore consistent with an abscess and signs of mastitis. Likely bacterial entry point from previous piercings. No signs of systemic illness. Discussed doxycycline for mastitis and skin infections, noting it is not used in breastfeeding due to potential harm to the baby. Explained that the antibiotic should help clear the infection. Ultrasound and diagnostic mammogram will be done to rule out deeper infection. Advised warm  compresses and Vaseline for drainage and dryness, respectively. Emphasized keeping the area covered to prevent irritation. - Prescribe doxycycline 100 mg twice a day for 7 days - Order ultrasound and diagnostic mammogram of the left breast - Advise warm compresses to encourage drainage - Recommend using Vaseline for dryness - Advise keeping the area covered to prevent irritation - Provide education on abscesses and mastitis via MyChart  Follow-up - Schedule ultrasound and diagnostic mammogram for later in the week or next week - Follow-up call to schedule appointments - Review results and reassess treatment plan as needed.         Meds ordered this encounter  Medications   doxycycline (VIBRA-TABS) 100 MG tablet     Sig: Take 1 tablet (100 mg total) by mouth 2 (two) times daily for 7 days.    Dispense:  14 tablet    Refill:  0     Return if symptoms worsen or fail to improve.      Shirlee Latch, MD  [facility] [phone] (phone) [phone] (fax)  [facility]

## 2023-06-08 ENCOUNTER — Other Ambulatory Visit: Payer: Self-pay | Admitting: Family Medicine

## 2023-06-08 DIAGNOSIS — N61 Mastitis without abscess: Secondary | ICD-10-CM

## 2023-06-08 DIAGNOSIS — N611 Abscess of the breast and nipple: Secondary | ICD-10-CM

## 2023-06-16 ENCOUNTER — Ambulatory Visit
Admission: RE | Admit: 2023-06-16 | Discharge: 2023-06-16 | Disposition: A | Discharge status: Home / Self Care | Payer: BC Managed Care – PPO | Source: Ambulatory Visit | Attending: Family Medicine | Admitting: Family Medicine

## 2023-06-16 DIAGNOSIS — L0291 Cutaneous abscess, unspecified: Secondary | ICD-10-CM

## 2023-06-16 DIAGNOSIS — N61 Mastitis without abscess: Secondary | ICD-10-CM

## 2023-06-16 DIAGNOSIS — N611 Abscess of the breast and nipple: Secondary | ICD-10-CM | POA: Insufficient documentation

## 2023-07-11 ENCOUNTER — Encounter: Payer: Self-pay | Admitting: Family Medicine

## 2023-07-11 NOTE — Telephone Encounter (Signed)
 Ok to refill the doxycycline 100mg  BID x7d as Rx'd at last visit.  If recurs or does not improve, needs evaluation.

## 2023-07-12 ENCOUNTER — Other Ambulatory Visit: Payer: Self-pay

## 2023-07-12 MED ORDER — DOXYCYCLINE HYCLATE 100 MG PO CAPS: 100.0000 mg | ORAL_CAPSULE | Freq: Two times a day (BID) | ORAL | 0 refills | Status: AC

## 2023-07-26 ENCOUNTER — Telehealth: Payer: Self-pay | Admitting: Family Medicine

## 2023-07-26 DIAGNOSIS — F3176 Bipolar disorder, in full remission, most recent episode depressed: Secondary | ICD-10-CM

## 2023-07-26 NOTE — Telephone Encounter (Signed)
 [facility] pharmacy is requesting refill lamoTRIgine (LAMICTAL) 100 MG tablet   Please advise

## 2023-07-28 ENCOUNTER — Telehealth: Payer: Self-pay | Admitting: Family Medicine

## 2023-07-28 NOTE — Telephone Encounter (Signed)
 [facility] pharmacy is requesting refill lamoTRIgine (LAMICTAL) 100 MG tablet   Please advise

## 2023-07-28 NOTE — Telephone Encounter (Signed)
 Prescribed by Dr. Elna Breslow - must refill through prescribing provider.

## 2023-07-29 ENCOUNTER — Telehealth: Payer: Self-pay | Admitting: Family Medicine

## 2023-07-29 DIAGNOSIS — F3176 Bipolar disorder, in full remission, most recent episode depressed: Secondary | ICD-10-CM

## 2023-07-29 NOTE — Telephone Encounter (Signed)
 [facility] pharmacy is requesting refill lamoTRIgine (LAMICTAL) 100 MG tablet  Please advise

## 2023-08-01 MED ORDER — LAMOTRIGINE 100 MG PO TABS: 50.0000 mg | ORAL_TABLET | Freq: Two times a day (BID) | ORAL | 0 refills | Status: AC

## 2023-08-01 NOTE — Addendum Note (Signed)
 Addended by: Liz Beach on: 08/01/2023 01:58 PM   Modules accepted: Orders

## 2023-08-01 NOTE — Telephone Encounter (Signed)
 Correction on MG--[facility] 2nd request -lamoTRIgine (LAMICTAL) 25 MG tablet  Please advise

## 2023-08-03 ENCOUNTER — Other Ambulatory Visit: Payer: Self-pay | Admitting: Psychiatry

## 2023-08-03 DIAGNOSIS — F411 Generalized anxiety disorder: Secondary | ICD-10-CM

## 2023-08-03 DIAGNOSIS — F3175 Bipolar disorder, in partial remission, most recent episode depressed: Secondary | ICD-10-CM

## 2023-08-24 ENCOUNTER — Telehealth: Payer: Self-pay

## 2023-08-24 DIAGNOSIS — L0291 Cutaneous abscess, unspecified: Secondary | ICD-10-CM

## 2023-08-24 DIAGNOSIS — N61 Mastitis without abscess: Secondary | ICD-10-CM

## 2023-08-24 DIAGNOSIS — N6452 Nipple discharge: Secondary | ICD-10-CM

## 2023-08-24 NOTE — Telephone Encounter (Unsigned)
 Copied from CRM [id number]. Topic: Clinical - Medical Advice >> Aug 24, 2023 10:05 AM Clayton Bibles wrote: Reason for CRM:  She is still having discharge on her left nipple; sometimes it's thick and white and sometimes clear. It has not got worse but no better. She wants to see what to do to get it better. Please call her to discuss. Thanks

## 2023-08-25 NOTE — Telephone Encounter (Signed)
 Recommend referral to OBGyn since it has not resolved - ok to place referral to [facility] or wherever she would like if she is not established somewhere.

## 2023-08-25 NOTE — Telephone Encounter (Signed)
 Detailed voicemail left per [facility]

## 2023-09-08 ENCOUNTER — Encounter: Payer: Self-pay | Admitting: Certified Nurse Midwife

## 2023-09-08 ENCOUNTER — Ambulatory Visit (INDEPENDENT_AMBULATORY_CARE_PROVIDER_SITE_OTHER): Admitting: Certified Nurse Midwife

## 2023-09-08 VITALS — BP 115/66 | HR 67 | Wt 180.0 lb

## 2023-09-08 DIAGNOSIS — N6452 Nipple discharge: Secondary | ICD-10-CM

## 2023-09-08 NOTE — Progress Notes (Signed)
 Suzanne Speed, MD   Chief Complaint  Patient presents with   Breast Problem    HPI:      Suzanne Boyd is a 33 y.o. G0P0000 whose LMP was No LMP recorded. (Menstrual status: IUD)., presents today for nipple discharge from left breast. This has been happening for almost a year, off & on but more often in the last month. The discharge is clear to white, has been yellow. Has noticed discharge from nipple as well as from site of old nipple piercing. Denies recent injury or trauma. She was treated in January for abscess with mastitis in left breast. Had mammogram & ultrasound 06/16/2023 with benign findings.    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   Passive suicidal ideations 04/21/2021   Bipolar 2 disorder, major depressive episode (HCC) 04/17/2020   GAD (generalized anxiety disorder) 04/17/2020   Insomnia 04/17/2020   Family history of breast cancer 10/30/2019   Family history of diabetes mellitus 08/29/2017   Overweight 07/04/2017    Past Surgical History:  Procedure Laterality Date   INTRAUTERINE DEVICE (IUD) INSERTION      Family History  Problem Relation Age of Onset   Hyperlipidemia Mother    Hypertension Mother    Varicose Veins Mother    Hypertension Father    Thyroid  disease Father    Healthy Brother    Bipolar disorder Maternal Aunt    Cancer - Colon Maternal Uncle    Thyroid  disease Maternal  Grandfather    Cancer Maternal Grandmother 50       Breast   Depression Maternal Grandmother    Brain cancer Maternal Grandmother    Cancer Other 55       breast   Anxiety disorder Other    Cancer Other 55       breast   Cancer Other 55       breast   Cancer Other 55       breast   Cancer Other 60       breast   Cancer Other 60       breast   Cancer Other 60       breast   Ovarian cancer Neg Hx     Social History   Socioeconomic History   Marital status: Divorced    Spouse name: Not on file   Number of children: 0   Years of education: 12   Highest education level: High school graduate  Occupational History   Occupation: Air cabin crew    Comment: full time  Tobacco Use   Smoking status: Never   Smokeless tobacco: Never  Vaping Use   Vaping status: Never Used  Substance and Sexual Activity   Alcohol use: Not Currently    Comment: occasional- 1 drink per month   Drug use: Yes    Types: Marijuana    Comment: last used 3 weeks ago   Sexual activity: Yes  Partners: Male    Birth control/protection: I.U.D.    Comment: [place]ena   Other Topics Concern   Not on file  Social History Narrative   Not on file   Social Drivers of Health   Financial Resource Strain: Low Risk  (10/25/2017)   Overall Financial Resource Strain (CARDIA)    Difficulty of Paying Living Expenses: Not hard at all  Food Insecurity: No Food Insecurity (10/25/2017)   Hunger Vital Sign    Worried About Running Out of Food in the Last Year: Never true    Ran Out of Food in the Last Year: Never true  Transportation Needs: No Transportation Needs (10/25/2017)   PRAPARE - Administrator, Civil Service (Medical): No    Lack of Transportation (Non-Medical): No  Physical Activity: Inactive (07/04/2017)   Exercise Vital Sign    Days of Exercise per Week: 0 days    Minutes of Exercise per Session: 0 min  Stress: Stress Concern Present (07/04/2017)   [employer] of Occupational Health  - Occupational Stress Questionnaire    Feeling of Stress : To some extent  Social Connections: Moderately Isolated (10/25/2017)   Social Connection and Isolation Panel [NHANES]    Frequency of Communication with Friends and Family: Once a week    Frequency of Social Gatherings with Friends and Family: Once a week    Attends Religious Services: Never    Database administrator or Organizations: No    Attends Banker Meetings: Never    Marital Status: Married  Catering manager Violence: Not At Risk (10/25/2017)   Humiliation, Afraid, Rape, and Kick questionnaire    Fear of Current or Ex-Partner: No    Emotionally Abused: No    Physically Abused: No    Sexually Abused: No    Outpatient Medications Prior to Visit  Medication Sig Dispense Refill   FLUoxetine  (PROZAC ) 20 MG capsule Take 1 capsule (20 mg total) by mouth daily with breakfast. 90 capsule 1   hydrOXYzine  (ATARAX ) 10 MG tablet TAKE 1 TABLET(10 MG) BY MOUTH TWICE DAILY AS NEEDED FOR ANXIETY OR SEVERE ANXIETY 60 tablet 1   ipratropium (ATROVENT ) 0.06 % nasal spray Place 2 sprays into both nostrils 4 (four) times daily. 15 mL 12   lamoTRIgine  (LAMICTAL ) 100 MG tablet Take 0.5 tablets (50 mg total) by mouth 2 (two) times daily. 90 tablet 0   levonorgestrel  (KYLEENA ) 19.5 MG IUD 1 each by Intrauterine route once.     propranolol  (INDERAL ) 10 MG tablet TAKE 1 TABLET BY MOUTH THREE TIMES DAILY AS NEEDED FOR RESTLESNESS AND ANXIETY 270 tablet 0   traZODone  (DESYREL ) 50 MG tablet Take 1.5-2 tablets (75-100 mg total) by mouth at bedtime as needed for sleep. 180 tablet 2   VRAYLAR  1.5 MG capsule Take 1.5 mg by mouth daily.     No facility-administered medications prior to visit.      ROS:  Review of Systems  Constitutional: Negative.   Respiratory: Negative.    Cardiovascular: Negative.   Skin:        Nipple discharge, left nipple     OBJECTIVE:   Vitals:  BP 115/66   Pulse 67   Wt 180 lb (81.6 kg)   BMI 31.89  kg/m   Physical Exam Constitutional:      General: She is not in acute distress.    Appearance: Normal appearance.  Cardiovascular:     Rate and Rhythm: Normal rate.  Pulmonary:     Effort: Pulmonary  effort is normal.  Chest:  Breasts:    Tanner Score is 5.     Right: No mass or nipple discharge.     Left: No mass or nipple discharge.     Comments: Site of old piercings well healed bilaterally without discharge. Lymphadenopathy:     Upper Body:     Right upper body: No axillary adenopathy.     Left upper body: No axillary adenopathy.  Neurological:     General: No focal deficit present.     Mental Status: She is alert and oriented to person, place, and time.  Psychiatric:        Mood and Affect: Mood normal.        Behavior: Behavior normal.     Results: No results found for this or any previous visit (from the past 24 hours).   Assessment/Plan: Nipple discharge  Reviewed prior ultrasound & mammogram, no evidence of malignancy or explanation for discharge. Discussed that discharge could be from prozac , though risk of galactorrhea with SSRI is low and symptoms started prior to starting this medication. May also be due to collection of fluid/pus in old piercing tract and with infection now resolved discharge may not reoccur. Repeat prolactin, TSH, BMP today to rule out metabolic cause. If discharge has color change or becomes bloody would recommend repeat imaging.  No orders of the defined types were placed in this encounter.    Forestine Igo, CNM 09/08/2023 9:34 AM

## 2023-09-09 ENCOUNTER — Encounter: Payer: Self-pay | Admitting: Certified Nurse Midwife

## 2023-09-09 LAB — BASIC METABOLIC PANEL WITH GFR
BUN/Creatinine Ratio: 20 (ref 9–23)
BUN: 13 mg/dL (ref 6–20)
CO2: 19 mmol/L — ABNORMAL LOW (ref 20–29)
Calcium: 8.9 mg/dL (ref 8.7–10.2)
Chloride: 101 mmol/L (ref 96–106)
Creatinine, Ser: 0.64 mg/dL (ref 0.57–1.00)
Glucose: 94 mg/dL (ref 70–99)
Potassium: 4.4 mmol/L (ref 3.5–5.2)
Sodium: 133 mmol/L — ABNORMAL LOW (ref 134–144)
eGFR: 120 mL/min/{1.73_m2} (ref >59)

## 2023-09-09 LAB — PROLACTIN: Prolactin: 10.9 ng/mL (ref 4.8–33.4)

## 2023-09-09 LAB — TSH RFX ON ABNORMAL TO FREE T4: TSH: 0.78 u[IU]/mL (ref 0.450–4.500)
